# Patient Record
Sex: Female | Born: 1980
Health system: Southern US, Community
[De-identification: ages and names within clinical notes are randomized; demographics above are authoritative.]

## PROBLEM LIST (undated history)

## (undated) DIAGNOSIS — R1310 Dysphagia, unspecified: Secondary | ICD-10-CM

## (undated) DIAGNOSIS — J3089 Other allergic rhinitis: Secondary | ICD-10-CM

## (undated) DIAGNOSIS — K219 Gastro-esophageal reflux disease without esophagitis: Secondary | ICD-10-CM

## (undated) DIAGNOSIS — R87629 Unspecified abnormal cytological findings in specimens from vagina: Secondary | ICD-10-CM

## (undated) DIAGNOSIS — N63 Unspecified lump in unspecified breast: Secondary | ICD-10-CM

## (undated) DIAGNOSIS — N926 Irregular menstruation, unspecified: Secondary | ICD-10-CM

## (undated) HISTORY — DX: Unspecified abnormal cytological findings in specimens from vagina: R87.629

## (undated) HISTORY — DX: Irregular menstruation, unspecified: N92.6

## (undated) HISTORY — PX: TUBAL LIGATION: SHX77

## (undated) HISTORY — DX: Dysphagia, unspecified: R13.10

---
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
## 2005-06-12 ENCOUNTER — Ambulatory Visit: Payer: Self-pay | Admitting: Family Medicine

## 2007-01-25 ENCOUNTER — Observation Stay: Payer: Self-pay | Admitting: Obstetrics and Gynecology

## 2007-01-29 ENCOUNTER — Observation Stay: Payer: Self-pay | Admitting: Obstetrics and Gynecology

## 2007-02-04 ENCOUNTER — Inpatient Hospital Stay: Payer: Self-pay

## 2009-02-12 ENCOUNTER — Ambulatory Visit: Payer: Self-pay | Admitting: Obstetrics and Gynecology

## 2009-02-19 ENCOUNTER — Ambulatory Visit: Payer: Self-pay | Admitting: Obstetrics and Gynecology

## 2010-05-31 ENCOUNTER — Ambulatory Visit: Payer: Self-pay | Admitting: Gastroenterology

## 2010-06-14 ENCOUNTER — Ambulatory Visit: Payer: Self-pay | Admitting: Gastroenterology

## 2010-06-16 LAB — PATHOLOGY REPORT

## 2013-02-17 ENCOUNTER — Ambulatory Visit: Payer: Self-pay | Admitting: Gastroenterology

## 2013-02-19 LAB — PATHOLOGY REPORT

## 2014-12-19 ENCOUNTER — Encounter
Admission: EM | Disposition: A | Discharge status: Home / Self Care | Payer: Self-pay | Source: Home / Self Care | Attending: Emergency Medicine

## 2014-12-19 ENCOUNTER — Ambulatory Visit: Admission: EM | Admit: 2014-12-19 | Payer: Self-pay | Source: Ambulatory Visit | Admitting: Gastroenterology

## 2014-12-19 ENCOUNTER — Ambulatory Visit: Payer: BLUE CROSS/BLUE SHIELD | Admitting: Anesthesiology

## 2014-12-19 ENCOUNTER — Other Ambulatory Visit: Payer: Self-pay | Admitting: *Deleted

## 2014-12-19 ENCOUNTER — Ambulatory Visit
Admission: EM | Admit: 2014-12-19 | Discharge: 2014-12-19 | Disposition: A | Discharge status: Home / Self Care | Payer: BLUE CROSS/BLUE SHIELD | Attending: Emergency Medicine | Admitting: Emergency Medicine

## 2014-12-19 ENCOUNTER — Encounter: Payer: Self-pay | Admitting: Emergency Medicine

## 2014-12-19 DIAGNOSIS — K319 Disease of stomach and duodenum, unspecified: Secondary | ICD-10-CM | POA: Insufficient documentation

## 2014-12-19 DIAGNOSIS — X58XXXA Exposure to other specified factors, initial encounter: Secondary | ICD-10-CM | POA: Insufficient documentation

## 2014-12-19 DIAGNOSIS — R4702 Dysphasia: Secondary | ICD-10-CM | POA: Diagnosis present

## 2014-12-19 DIAGNOSIS — T18128A Food in esophagus causing other injury, initial encounter: Secondary | ICD-10-CM | POA: Insufficient documentation

## 2014-12-19 HISTORY — PX: ESOPHAGOGASTRODUODENOSCOPY: SHX5428

## 2014-12-19 HISTORY — PX: FOREIGN BODY REMOVAL: SHX962

## 2014-12-19 HISTORY — DX: Gastro-esophageal reflux disease without esophagitis: K21.9

## 2014-12-19 LAB — CBC WITH DIFFERENTIAL/PLATELET
Basophils Absolute: 0 10*3/uL (ref 0–0.1)
Basophils Relative: 1 %
Eosinophils Absolute: 0.4 10*3/uL (ref 0–0.7)
Eosinophils Relative: 8 %
HCT: 37.6 % (ref 35.0–47.0)
HEMOGLOBIN: 11.8 g/dL — AB (ref 12.0–16.0)
LYMPHS PCT: 31 %
Lymphs Abs: 1.5 10*3/uL (ref 1.0–3.6)
MCH: 24.7 pg — ABNORMAL LOW (ref 26.0–34.0)
MCHC: 31.4 g/dL — ABNORMAL LOW (ref 32.0–36.0)
MCV: 78.5 fL — ABNORMAL LOW (ref 80.0–100.0)
Monocytes Absolute: 0.5 10*3/uL (ref 0.2–0.9)
Monocytes Relative: 10 %
NEUTROS ABS: 2.4 10*3/uL (ref 1.4–6.5)
Neutrophils Relative %: 50 %
Platelets: 305 10*3/uL (ref 150–440)
RBC: 4.78 MIL/uL (ref 3.80–5.20)
RDW: 15.3 % — AB (ref 11.5–14.5)
WBC: 4.9 10*3/uL (ref 3.6–11.0)

## 2014-12-19 LAB — BASIC METABOLIC PANEL
Anion gap: 5 (ref 5–15)
BUN: 8 mg/dL (ref 6–20)
CHLORIDE: 111 mmol/L (ref 101–111)
CO2: 24 mmol/L (ref 22–32)
CREATININE: 0.87 mg/dL (ref 0.44–1.00)
Calcium: 9.1 mg/dL (ref 8.9–10.3)
GFR calc Af Amer: >60 mL/min (ref >60)
Glucose, Bld: 118 mg/dL — ABNORMAL HIGH (ref 65–99)
Potassium: 4.2 mmol/L (ref 3.5–5.1)
Sodium: 140 mmol/L (ref 135–145)

## 2014-12-19 LAB — POCT PREGNANCY, URINE: Preg Test, Ur: NEGATIVE

## 2014-12-19 SURGERY — EGD (ESOPHAGOGASTRODUODENOSCOPY)
Anesthesia: General

## 2014-12-19 SURGERY — ESOPHAGOGASTRODUODENOSCOPY (EGD) WITH PROPOFOL
Anesthesia: General

## 2014-12-19 MED ORDER — LIDOCAINE VISCOUS 2 % MT SOLN
OROMUCOSAL | Status: AC
  Filled 2014-12-19: qty 15

## 2014-12-19 MED ORDER — LIDOCAINE HCL (CARDIAC) 20 MG/ML IV SOLN
INTRAVENOUS | Status: DC | PRN
  Administered 2014-12-19: 60 mg via INTRAVENOUS

## 2014-12-19 MED ORDER — ONDANSETRON HCL 4 MG/2ML IJ SOLN: 4.0000 mg | Freq: Once | INTRAMUSCULAR | Status: DC | PRN

## 2014-12-19 MED ORDER — GLUCAGON HCL (RDNA) 1 MG IJ SOLR
1.0000 mg | Freq: Once | INTRAMUSCULAR | Status: AC
  Administered 2014-12-19: 1 mg via INTRAVENOUS

## 2014-12-19 MED ORDER — PROPOFOL 10 MG/ML IV BOLUS
INTRAVENOUS | Status: DC | PRN
  Administered 2014-12-19: 20 mg via INTRAVENOUS
  Administered 2014-12-19: 180 mg via INTRAVENOUS

## 2014-12-19 MED ORDER — PHENYLEPHRINE HCL 10 MG/ML IJ SOLN
INTRAMUSCULAR | Status: DC | PRN
  Administered 2014-12-19: 100 ug via INTRAVENOUS

## 2014-12-19 MED ORDER — ONDANSETRON HCL 4 MG/2ML IJ SOLN
INTRAMUSCULAR | Status: DC | PRN
  Administered 2014-12-19: 4 mg via INTRAVENOUS

## 2014-12-19 MED ORDER — LIDOCAINE VISCOUS 2 % MT SOLN
15.0000 mL | Freq: Once | OROMUCOSAL | Status: AC
  Administered 2014-12-19: 15 mL via OROMUCOSAL

## 2014-12-19 MED ORDER — SUCCINYLCHOLINE CHLORIDE 20 MG/ML IJ SOLN
INTRAMUSCULAR | Status: DC | PRN
  Administered 2014-12-19: 100 mg via INTRAVENOUS

## 2014-12-19 MED ORDER — FENTANYL CITRATE (PF) 100 MCG/2ML IJ SOLN
INTRAMUSCULAR | Status: DC | PRN
  Administered 2014-12-19 (×2): 50 ug via INTRAVENOUS

## 2014-12-19 MED ORDER — EPHEDRINE SULFATE 50 MG/ML IJ SOLN
INTRAMUSCULAR | Status: DC | PRN
  Administered 2014-12-19: 10 mg via INTRAVENOUS

## 2014-12-19 MED ORDER — GLUCAGON HCL RDNA (DIAGNOSTIC) 1 MG IJ SOLR
INTRAMUSCULAR | Status: AC
  Administered 2014-12-19: 1 mg via INTRAVENOUS
  Filled 2014-12-19: qty 1

## 2014-12-19 MED ORDER — DEXAMETHASONE SODIUM PHOSPHATE 4 MG/ML IJ SOLN
INTRAMUSCULAR | Status: DC | PRN
  Administered 2014-12-19: 10 mg via INTRAVENOUS

## 2014-12-19 MED ORDER — SODIUM CHLORIDE 0.9 % IV SOLN
INTRAVENOUS | Status: DC
  Administered 2014-12-19: 15:00:00 via INTRAVENOUS

## 2014-12-19 MED ORDER — FENTANYL CITRATE (PF) 100 MCG/2ML IJ SOLN: 25.0000 ug | INTRAMUSCULAR | Status: DC | PRN

## 2014-12-19 NOTE — Transfer of Care (Signed)
Immediate Anesthesia Transfer of Care Note  Patient: Elizabeth Lawson  Procedure(s) Performed: Procedure(s): ESOPHAGOGASTRODUODENOSCOPY (EGD) (N/A) FOREIGN BODY REMOVAL (N/A)  Patient Location: PACU  Anesthesia Type:General  Level of Consciousness: awake, alert  and oriented  Airway & Oxygen Therapy: Patient Spontanous Breathing and Patient connected to face mask oxygen  Post-op Assessment: Report given to RN and Post -op Vital signs reviewed and stable  Post vital signs: stable  Last Vitals:  Filed Vitals:   12/19/14 1607  BP: 106/68  Pulse: 91  Temp: 36.9 C  Resp: 16    Complications: No apparent anesthesia complications

## 2014-12-19 NOTE — Anesthesia Preprocedure Evaluation (Signed)
Anesthesia Evaluation  Patient identified by MRN, date of birth, ID band Patient awake    Reviewed: Allergy & Precautions, NPO status , Patient's Chart, lab work & pertinent test results  History of Anesthesia Complications Negative for: history of anesthetic complications  Airway Mallampati: II  TM Distance: >3 FB Neck ROM: Full    Dental no notable dental hx.    Pulmonary neg pulmonary ROS,  breath sounds clear to auscultation  Pulmonary exam normal       Cardiovascular Exercise Tolerance: Good negative cardio ROS Normal cardiovascular examRhythm:Regular Rate:Normal     Neuro/Psych negative neurological ROS  negative psych ROS   GI/Hepatic Neg liver ROS, GERD-  Medicated,  Endo/Other  negative endocrine ROS  Renal/GU negative Renal ROS  negative genitourinary   Musculoskeletal negative musculoskeletal ROS (+)   Abdominal   Peds negative pediatric ROS (+)  Hematology negative hematology ROS (+)   Anesthesia Other Findings   Reproductive/Obstetrics negative OB ROS                             Anesthesia Physical Anesthesia Plan  ASA: II  Anesthesia Plan: General   Post-op Pain Management:    Induction: Intravenous, Cricoid pressure planned and Rapid sequence  Airway Management Planned: Oral ETT  Additional Equipment:   Intra-op Plan:   Post-operative Plan: Extubation in OR  Informed Consent:   Dental advisory given  Plan Discussed with: CRNA and Surgeon  Anesthesia Plan Comments:         Anesthesia Quick Evaluation

## 2014-12-19 NOTE — Anesthesia Postprocedure Evaluation (Signed)
  Anesthesia Post-op Note  Patient: Elizabeth Lawson  Procedure(s) Performed: Procedure(s): ESOPHAGOGASTRODUODENOSCOPY (EGD) (N/A) FOREIGN BODY REMOVAL (N/A)  Anesthesia type:General  Patient location: PACU  Post pain: Pain level controlled  Post assessment: Post-op Vital signs reviewed, Patient's Cardiovascular Status Stable, Respiratory Function Stable, Patent Airway and No signs of Nausea or vomiting  Post vital signs: Reviewed and stable  Last Vitals:  Filed Vitals:   12/19/14 1652  BP: 133/90  Pulse: 84  Temp: 36.9 C  Resp: 17    Level of consciousness: awake, alert  and patient cooperative  Complications: No apparent anesthesia complications

## 2014-12-19 NOTE — ED Notes (Signed)
No resolution of symptoms with oral lidocaine. MD notified

## 2014-12-19 NOTE — ED Notes (Signed)
Patient states that she is having difficulty swallowing. Patient states that she has been seen prior by GI and has had upper endoscopy with diagnosis of GERD. Patient denies vomiting.

## 2014-12-19 NOTE — ED Notes (Signed)
Pt to ed with c/o difficulty swallowing since yesterday, states hx of same but has been told it is GERD.  Pt reports she is unable to even swallow liquids since yesterday.  Pt alert and oriented.

## 2014-12-19 NOTE — Anesthesia Procedure Notes (Signed)
Procedure Name: Intubation Date/Time: 12/19/2014 2:58 PM Performed by: Aline Brochure Pre-anesthesia Checklist: Patient identified, Emergency Drugs available, Suction available and Patient being monitored Patient Re-evaluated:Patient Re-evaluated prior to inductionOxygen Delivery Method: Circle system utilized Preoxygenation: Pre-oxygenation with 100% oxygen Intubation Type: IV induction, Rapid sequence and Cricoid Pressure applied Laryngoscope Size: McGraph Grade View: Grade I Tube type: Oral Tube size: 7.0 mm Number of attempts: 2 Airway Equipment and Method: Stylet Placement Confirmation: ETT inserted through vocal cords under direct vision,  positive ETCO2 and breath sounds checked- equal and bilateral Secured at: 21 cm Tube secured with: Tape Dental Injury: Teeth and Oropharynx as per pre-operative assessment

## 2014-12-19 NOTE — Consult Note (Signed)
GI Inpatient Consult Note Lollie Sails MD  Reason for Consult: Esophageal foreign body   Attending Requesting Consult: Dr. Lisa Roca  Outpatient Primary Physician: Not known  History of Present Illness: Elizabeth Lawson is a 34 y.o. female who presented to the emergency room early this morning following an episode of food getting apparently hung in the esophagus. She states that she was eating a steak last night and could not pass any water. States that the steak did come up after trying some water but then she was able able to get water down afterwards. In the emergency room several use of been tried including some viscous lidocaine and glucagon both of which failed to alleviate this symptom.  She states that she has had increasing symptoms for about a year and has had symptoms of dysphagia for several years. Indeed I did an EGD with her on 06/14/2010 that showing some mild gastritis and esophagitis but negative for other entities. Repeat EGD for this similar symptoms and 02/17/2013. At that time biopsies were consistent with eosinophilic esophagitis and eosinophilic gastritis. She had been prescribed a PPI at that time and on the initial EGD but has not been taking them. States that she will occasionally regurgitate foods. She denies any, or chest pain with the exception of when she gets food that seems to be going down slowly. She denies any heartburn. No nausea or vomiting otherwise. As a bowel movement about every 2 days. There is no black stool blood in the stool or slimy stools.  Her biopsies on both scopes were negative for Helicobacter pylori.  GI family history is pertinent for father with peptic ulcer disease. Negative otherwise  Past Medical History:  Past Medical History  Diagnosis Date  . GERD (gastroesophageal reflux disease)    patient denies taking any medications as an outpatient with the exception of occasional Benadryl or Allegra and occasional Tylenol. She denies  use of aspirin or NSAID products. Patient denies any ongoing medical treatment such as for high blood pressure diabetes thyroid problems or otherwise.  She has had 1 normal vaginal delivery without complication. Problem List: There are no active problems to display for this patient.   Past Surgical History: History reviewed. No pertinent past surgical history.  Allergies: No Known Allergies  Home Medications:  (Not in a hospital admission) Home medication reconciliation was completed with the patient.   Scheduled Inpatient Medications: None/not applicable    Continuous Inpatient Infusions: Saline    PRN Inpatient Medications: None/not applicable   Family History: family history includes Hypertension in her father.   GI Family History: Noted above  Social History:   reports that she has never smoked. She does not have any smokeless tobacco history on file. She reports that she does not drink alcohol or use illicit drugs. The patient denies ETOH, tobacco, or drug use.   ROS  Review of Systems:   Physical Examination: BP 113/66 mmHg  Pulse 80  Temp(Src) 98.1 F (36.7 C) (Oral)  Resp 18  Ht 5\' 10"  (1.778 m)  Wt 117.935 kg (260 lb)  BMI 37.31 kg/m2  SpO2 99%  LMP 12/05/2014 Gen: Well-appearing 34 year old Caucasian female no acute distress HEENT: Normocephalic atraumatic eyes are anicteric Neck: Supple no JVD no lymphadenopathy no thyromegaly Chest: Regular rate and rhythm without rub or gallop CV: Bilaterally clear to auscultation Abd: Soft nontender nondistended bowel sounds positive normoactive Ext: No clubbing cyanosis or edema Skin: Other:  Data: Lab Results  Component Value Date  WBC 4.9 12/19/2014   HGB 11.8* 12/19/2014   HCT 37.6 12/19/2014   MCV 78.5* 12/19/2014   PLT 305 12/19/2014    Recent Labs Lab 12/19/14 0941  HGB 11.8*   Lab Results  Component Value Date   NA 140 12/19/2014   K 4.2 12/19/2014   CL 111 12/19/2014   CO2 24  12/19/2014   BUN 8 12/19/2014   CREATININE 0.87 12/19/2014   No results found for: ALT, AST, GGT, ALKPHOS, BILITOT No results for input(s): APTT, INR, PTT in the last 168 hours. CBC Latest Ref Rng 12/19/2014  WBC 3.6 - 11.0 K/uL 4.9  Hemoglobin 12.0 - 16.0 g/dL 11.8(L)  Hematocrit 35.0 - 47.0 % 37.6  Platelets 150 - 440 K/uL 305    STUDIES: No results found. @IMAGES @  Assessment: 1. Possible esophageal foreign body. Highly suspect for eosinophilic esophagitis. Patient is not been taking any medications in this regard.  Recommendations: 1. Recommend EGD with anesthesia assistance. We'll be necessary to intubate the patient to protect the airway. I have discussed the risks benefits and complications of procedures to include not limited to bleeding, infection, perforation and the risk of sedation and the patient wishes to proceed.  Thank you for the consult. Please call with questions or concerns.  Lollie Sails, MD  12/19/2014 11:45 AM

## 2014-12-19 NOTE — OR Nursing (Signed)
Pt recovered in PACU, and discharge instructions were given to pt husband per ENDO staff RN and Dr Donnella Sham.  Pt had uneventful recovery and minimal nausea.  Discharged home with family, stable.

## 2014-12-19 NOTE — Op Note (Addendum)
[facility] Gastroenterology Patient Name: Elizabeth Lawson Procedure Date: 12/19/2014 12:24 PM MRN: [id number] Account #: [id number] Date of Birth: 11-Jun-1981 Admit Type: Outpatient Age: 34 Room: [facility] ENDO ROOM 4 Gender: Female Note Status: Finalized Procedure:         Upper GI endoscopy Indications:       Therapeutic procedure, Foreign body in the esophagus Providers:         Lollie Sails, MD Referring MD:      Guadalupe Maple, MD (Referring MD) Medicines:         General Anesthesia Complications:     No immediate complications. Procedure:         Pre-Anesthesia Assessment:                    - ASA Grade Assessment: I - A normal, healthy patient.                    After obtaining informed consent, the endoscope was passed                     under direct vision. Throughout the procedure, the                     patient's blood pressure, pulse, and oxygen saturations                     were monitored continuously. The Olympus GIF-160 endoscope                     (S#. [id number]) was introduced through the mouth, and                     advanced to the third part of duodenum. The upper GI                     endoscopy was performed with moderate difficulty due to                     presence of food. The patient tolerated the procedure well. Findings:      Food was found in the middle third of the esophagus and in the lower       third of the esophagus. Removal of food was accomplished with a Jabier Mutton net       in several passes. The esophagus itself seemed to have a normal lumen       dimension, however there were mild rings noted at times consistant with       either eosinphilic esophagitis or feline motility. There is a mild       mucosal irritation in the distal esophagus though no overt erosions.      Once the food debris was cleared, entrance into the stomach seemed       restricted, though there is no obvious ring or stricture. This was       passed and  scope was moved into the gastric vault.      A few localized, small non-bleeding erosions were found in the gastric       antrum. There were no stigmata of recent bleeding.      The cardia and gastric fundus were normal on retroflexion.      The examined duodenum was normal. Impression:        - Food in the middle third of the esophagus and in  the                     lower third of the esophagus. Removal was successful.                    - Non-bleeding erosive gastropathy.                    - Normal examined duodenum. Recommendation:    - Discharge patient to home.                    - Use Protonix (pantoprazole) 40 mg PO BID for 6 weeks.                    - Use Protonix (pantoprazole) 40 mg PO daily daily.                    - Repeat the upper endoscopy in 6 weeks to check healing. Procedure Code(s): --- Professional ---                    [phone], Esophagogastroduodenoscopy, flexible, transoral;                     with removal of foreign body(s) Diagnosis Code(s): --- Professional ---                    935.1, Foreign body in esophagus                    [phone], Foreign body accidentally entering other orifice                    537.9, Unspecified disorder of stomach and duodenum CPT copyright 2014 [employer]. All rights reserved. The codes documented in this report are preliminary and upon coder review may  be revised to meet current compliance requirements. Lollie Sails, MD 12/19/2014 4:13:50 PM This report has been signed electronically. Number of Addenda: 0 Note Initiated On: 12/19/2014 12:24 PM      [facility]

## 2014-12-19 NOTE — OR Nursing (Signed)
Pt intubated per crna and anesthiologist. See notes

## 2014-12-19 NOTE — ED Provider Notes (Signed)
[facility] Emergency Department Provider Note   ____________________________________________  Time seen: 8 AM I have reviewed the triage vital signs and the triage nursing note.  HISTORY  Chief Complaint Dysphagia   Historian Patient  HPI Elizabeth Lawson is a 34 y.o. female who is complaining of trouble swallowing including trouble swallowing water. She had trouble eating her dinner of steak and potatoes last night due to similar problems in the past of dysphagia. She has had 2 endoscopies which showed signs of GERD but no stricture. She often has problems swallowing solid foods, however after last night she's been having trouble swallowing water and spitting it right back up. She did sleep overnight and so she thinks that she was managing her saliva. However this morning she was unable to swallow water without gagging and spitting up back up. Symptoms are moderate    Past Medical History  Diagnosis Date  . GERD (gastroesophageal reflux disease)     There are no active problems to display for this patient.   History reviewed. No pertinent past surgical history.  Current Outpatient Rx  Name  Route  Sig  Dispense  Refill  . diphenhydrAMINE (BENADRYL) 25 MG tablet   Oral   Take 100 mg by mouth daily.         . fexofenadine (ALLEGRA) 180 MG tablet   Oral   Take 180 mg by mouth daily.           Not currently on any acid reflux medications  Allergies Review of patient's allergies indicates no known allergies.  Family History  Problem Relation Age of Onset  . Hypertension Father     Social History History  Substance Use Topics  . Smoking status: Never Smoker   . Smokeless tobacco: Not on file  . Alcohol Use: No    Review of Systems  Constitutional: Negative for fever. Eyes: Negative for visual changes. ENT: Negative for sore throat. Cardiovascular: Negative for chest pain. Respiratory: Negative for shortness of  breath. Gastrointestinal: Negative for abdominal pain, vomiting and diarrhea. Genitourinary: Negative for dysuria. Musculoskeletal: Negative for back pain. Skin: Negative for rash. Neurological: Negative for headaches, focal weakness or numbness.  ____________________________________________   PHYSICAL EXAM:  VITAL SIGNS: ED Triage Vitals  Enc Vitals Group     BP 12/19/14 0736 131/101 mmHg     Pulse Rate 12/19/14 0736 91     Resp 12/19/14 0736 18     Temp 12/19/14 0736 98.1 F (36.7 C)     Temp Source 12/19/14 0736 Oral     SpO2 12/19/14 0736 97 %     Weight 12/19/14 0736 260 lb (117.935 kg)     Height 12/19/14 0736 5\' 10"  (1.778 m)     Head Cir --      Peak Flow --      Pain Score 12/19/14 0737 0     Pain Loc --      Pain Edu? --      Excl. in [place]? --      Constitutional: Alert and oriented. Well appearing and in no distress. Eyes: Conjunctivae are normal. PERRL. Normal extraocular movements. ENT   Head: Normocephalic and atraumatic.   Nose: No congestion/rhinnorhea.   Mouth/Throat: Mucous membranes are moist. Managing oral secretions well   Neck: No stridor. Cardiovascular: Normal rate, regular rhythm.  No murmurs, rubs, or gallops. Respiratory: Normal respiratory effort without tachypnea nor retractions. Breath sounds are clear and equal bilaterally. No wheezes/rales/rhonchi. Gastrointestinal: Soft and nontender.  No distention. Obese Genitourinary: Deferred Musculoskeletal: Nontender with normal range of motion in all extremities. No joint effusions.  No lower extremity tenderness nor edema. Neurologic:  Normal speech and language. No gross focal neurologic deficits are appreciated. Skin:  Skin is warm, dry and intact. No rash noted. Psychiatric: Mood and affect are normal. Speech and behavior are normal. Patient exhibits appropriate insight and judgment.  ____________________________________________    EKG  None ____________________________________________  LABS (pertinent positives/negatives)  Metabolic panel within normal limits CBC showing a normal white blood cell count 4.9 and hemoglobin at 11.8 no other significant abnormalities.  ____________________________________________  RADIOLOGY Radiologist results reviewed  None __________________________________________  PROCEDURES  Procedure(s) performed: None Critical Care performed: None  ____________________________________________   ED COURSE / ASSESSMENT AND PLAN  Pertinent labs & imaging results that were available during my care of the patient were reviewed by me and considered in my medical decision making (see chart for details).  Patient has had acute on chronic dysphagia, worsening since last night. Here in the emergency department she does appear able to manage her oral secretions. She's can be given viscous lidocaine and then attempt oral by mouth challenge.  After viscous lidocaine patient did spit this back up. She did try home which would not work either. She was given IV glucagon, and this did not work either.  Dr. Gustavo Lah with GI was consultation for esophageal obstruction at 9:40 AM. Patient and family were updated and are waiting on specialist consultation for plan/disposition.   ___________________________________________   FINAL CLINICAL IMPRESSION(S) / ED DIAGNOSES   Final diagnoses:  Dysphasia      Lisa Roca, MD 12/19/14 1116

## 2014-12-23 ENCOUNTER — Encounter: Payer: Self-pay | Admitting: Gastroenterology

## 2015-02-04 ENCOUNTER — Other Ambulatory Visit: Payer: Self-pay | Admitting: Gastroenterology

## 2015-02-04 DIAGNOSIS — R1310 Dysphagia, unspecified: Secondary | ICD-10-CM

## 2015-02-10 ENCOUNTER — Ambulatory Visit: Payer: BLUE CROSS/BLUE SHIELD

## 2015-03-02 ENCOUNTER — Ambulatory Visit
Admission: RE | Admit: 2015-03-02 | Payer: BLUE CROSS/BLUE SHIELD | Source: Ambulatory Visit | Admitting: Gastroenterology

## 2015-03-02 ENCOUNTER — Encounter: Admission: RE | Payer: Self-pay | Source: Ambulatory Visit

## 2015-03-02 SURGERY — ESOPHAGOGASTRODUODENOSCOPY (EGD) WITH PROPOFOL
Anesthesia: General

## 2015-05-19 LAB — HM PAP SMEAR: HM Pap smear: NEGATIVE

## 2015-06-14 HISTORY — PX: OTHER SURGICAL HISTORY: SHX169

## 2015-10-26 ENCOUNTER — Encounter: Payer: Self-pay | Admitting: Obstetrics and Gynecology

## 2015-12-22 ENCOUNTER — Encounter: Payer: Self-pay | Admitting: Obstetrics and Gynecology

## 2015-12-22 ENCOUNTER — Other Ambulatory Visit: Payer: Self-pay | Admitting: Obstetrics and Gynecology

## 2015-12-22 ENCOUNTER — Ambulatory Visit (INDEPENDENT_AMBULATORY_CARE_PROVIDER_SITE_OTHER): Payer: BLUE CROSS/BLUE SHIELD | Admitting: Obstetrics and Gynecology

## 2015-12-22 VITALS — BP 109/85 | HR 97 | Ht 70.0 in | Wt 256.3 lb

## 2015-12-22 DIAGNOSIS — IMO0002 Reserved for concepts with insufficient information to code with codable children: Secondary | ICD-10-CM

## 2015-12-22 DIAGNOSIS — R896 Abnormal cytological findings in specimens from other organs, systems and tissues: Secondary | ICD-10-CM

## 2015-12-22 DIAGNOSIS — Z8041 Family history of malignant neoplasm of ovary: Secondary | ICD-10-CM | POA: Diagnosis not present

## 2015-12-22 NOTE — Progress Notes (Signed)
Subjective:     Patient ID: Elizabeth Lawson, female   DOB: 03/16/1981, 35 y.o.   MRN: [id number]  HPI Here to establish care and needs repeat pap- has records- reveals h/o LGSIL on last two paps with same noted on colpo in Nov 2016. Needs follow-up pap now. Also reports mom with history of genetic ovarian cancer 5 years ago, and 2 maternal great aunts with breast cancer in their fifties. Desires genetic screening.  Review of Systems See above    Objective:   Physical Exam A&O x4  well groomed female in no distress Pelvic exam: normal external genitalia, vulva, vagina, cervix, uterus and adnexa, PAP: Pap smear done today.    Assessment:     LGSIL- follow-up Family history of maternal ovarian and breast cancers     Plan:     Pap obtained [facility] obtained RTC in 6 months or as needed  Melody Piqua, [place]

## 2015-12-22 NOTE — Patient Instructions (Signed)
Thank you for enrolling in [facility]. Please follow the instructions below to securely access your online medical record. MyChart allows you to send messages to your doctor, view your test results, renew your prescriptions, schedule appointments, and more.  How Do I Sign Up? 1. In your Internet browser, go to [email or web address]. 2. Click on the New  User? link in the Sign In box.  3. Enter your MyChart Access Code exactly as it appears below. You will not need to use this code after you have completed the sign-up process. If you do not sign up before the expiration date, you must request a new code. MyChart Access Code: [id number] Expires: 02/19/2016 10:42 AM  4. Enter the last four digits of your Social Security Number (xxxx) and Date of Birth (mm/dd/yyyy) as indicated and click Next. You will be taken to the next sign-up page. 5. Create a MyChart ID. This will be your MyChart login ID and cannot be changed, so think of one that is secure and easy to remember. 6. Create a MyChart password. You can change your password at any time. 7. Enter your Password Reset Question and Answer and click Next. This can be used at a later time if you forget your password.  8. Select your communication preference, and if applicable enter your e-mail address. You will receive e-mail notification when new information is available in MyChart by choosing to receive e-mail notifications and filling in your e-mail. 9. Click Sign In. You can now view your medical record.   Additional Information If you have questions, you can email [email or web address] or call [phone] to talk to our [facility] staff. Remember, MyChart is NOT to be used for urgent needs. For medical emergencies, dial 911.   Human Papillomavirus Human papillomavirus (HPV) is the most common sexually transmitted infection (STI) and is highly contagious. HPV infections cause genital warts and cancers to the outlet of the  womb (cervix), birth canal (vagina), opening of the birth canal (vulva), and anus. There are over 100 types of HPV. Unless wartlike lesions are present in the throat or there are genital warts that you can see or feel, HPV usually does not cause symptoms. It is possible to be infected for long periods and pass it on to others without knowing it. CAUSES  HPV is spread from person to person through sexual contact. This includes oral, vaginal, or anal sex. RISK FACTORS  Having unprotected sex. HPV can be spread by oral, vaginal, or anal sex.  Having several sex partners.  Having a sex partner who has other sex partners.  Having or having had another sexually transmitted infection. SIGNS AND SYMPTOMS  Most people carrying HPV do not have any symptoms. If symptoms are present, symptoms may include:  Wartlike lesions in the throat (from having oral sex).  Warts in the infected skin or mucous membranes.  Genital warts that may itch, burn, or bleed.  Genital warts that may be painful or bleed during sexual intercourse. DIAGNOSIS  If wartlike lesions are present in the throat or genital warts are present, your health care provider can usually diagnose HPV by physical examination.   Genital warts are easily seen with the naked eye.  Currently, there is no FDA-approved test to detect HPV in males.  In females, a Pap test can show cells that are infected with HPV.  In females, a scope can be used to view the cervix (colposcopy). A colposcopy can be performed if the pelvic exam  or Pap test is abnormal. A sample of tissue may be removed (biopsy) during the colposcopy. TREATMENT  There is no treatment for the virus itself. However, there are treatments for the health problems and symptoms HPV can cause. Your health care provider will follow you closely after you are treated. This is because the HPV can come back and may need treatment again. Treatment of HPV may include:   Medicines, which may be  injected or applied in a cream, lotion, or gel form.  Use of a probe to apply extreme cold (cryotherapy).  Application of an intense beam of light (laser treatment).  Use of a probe to apply extreme heat (electrocautery).  Surgery. HOME CARE INSTRUCTIONS   Take medicines only as directed by your health care provider.  Use over-the-counter creams for itching or irritation as directed by your health care provider.  Keep all follow-up visits as directed by your health care provider. This is important.  Do not touch or scratch the warts.  Do not treat genital warts with medicines used for treating hand warts.  Do not have sex while you are being treated.  Do not douche or use tampons during treatment of HPV.  Tell your sex partner about your infection because he or she may also need treatment.  If you become pregnant, tell your health care provider that you have had HPV. Your health care provider will monitor you closely during pregnancy to be sure your baby is safe.  After treatment, use condoms during sex to prevent future infections.  Have only one sex partner.  Have a sex partner who does not have other sex partners. PREVENTION   Talk to your health care provider about getting the HPV vaccines. These vaccines prevent some HPV infections and cancers. It is recommended that the vaccine be given to males and females between the ages of 3 and 69 years old. It will not work if you already have HPV, and it is not recommended for pregnant women.  A Pap test is done to screen for cervical cancer in women.  The first Pap test should be done at age 40 years.  Between ages 59 and 60 years, Pap tests are repeated every 2 years.  Beginning at age 94, you are advised to have a Pap test every 3 years as long as your past 3 Pap tests have been normal.  Some women have medical problems that increase the chance of getting cervical cancer. Talk to your health care provider about these  problems. It is especially important to talk to your health care provider if a new problem develops soon after your last Pap test. In these cases, your health care provider may recommend more frequent screening and Pap tests.  The above recommendations are the same for women who have or have not gotten the vaccine for HPV.  If you had a hysterectomy for a problem that was not a cancer or a condition that could lead to cancer, then you no longer need Pap tests. However, even if you no longer need a Pap test, a regular exam is a good idea to make sure no other problems are starting.   If you are between the ages of 65 and 50 years and you have had normal Pap tests going back 10 years, you no longer need Pap tests. However, even if you no longer need a Pap test, a regular exam is a good idea to make sure no other problems are starting.  If you  have had past treatment for cervical cancer or a condition that could lead to cancer, you need Pap tests and screening for cancer for at least 20 years after your treatment.  If Pap tests have been discontinued, risk factors (such as a new sexual partner)need to be reassessed to determine if screening should be resumed.  Some women may need screenings more often if they are at high risk for cervical cancer. SEEK MEDICAL CARE IF:   The treated skin becomes red, swollen, or painful.  You have a fever.  You feel generally ill.  You feel lumps or pimple-like projections in and around your genital area.  You develop bleeding of the vagina or the treatment area.  You have painful sexual intercourse. MAKE SURE YOU:   Understand these instructions.  Will watch your condition.  Will get help if you are not doing well or get worse.   This information is not intended to replace advice given to you by your health care provider. Make sure you discuss any questions you have with your health care provider.   Document Released: 09/23/2003 Document Revised:  07/24/2014 Document Reviewed: 10/08/2013 Elsevier Interactive Patient Education [employer].

## 2015-12-24 LAB — CYTOLOGY - PAP

## 2016-02-21 ENCOUNTER — Encounter: Payer: Self-pay | Admitting: Obstetrics and Gynecology

## 2016-03-01 ENCOUNTER — Encounter: Payer: Self-pay | Admitting: Obstetrics and Gynecology

## 2016-03-06 ENCOUNTER — Encounter: Payer: Self-pay | Admitting: Obstetrics and Gynecology

## 2016-06-16 ENCOUNTER — Encounter: Payer: BLUE CROSS/BLUE SHIELD | Admitting: Obstetrics and Gynecology

## 2016-06-22 ENCOUNTER — Encounter: Payer: BLUE CROSS/BLUE SHIELD | Admitting: Obstetrics and Gynecology

## 2016-08-31 ENCOUNTER — Other Ambulatory Visit: Payer: Self-pay | Admitting: Obstetrics and Gynecology

## 2016-08-31 ENCOUNTER — Ambulatory Visit (INDEPENDENT_AMBULATORY_CARE_PROVIDER_SITE_OTHER): Payer: BLUE CROSS/BLUE SHIELD | Admitting: Obstetrics and Gynecology

## 2016-08-31 ENCOUNTER — Encounter: Payer: Self-pay | Admitting: Obstetrics and Gynecology

## 2016-08-31 VITALS — BP 113/80 | HR 112 | Ht 70.0 in | Wt 261.1 lb

## 2016-08-31 DIAGNOSIS — Z01419 Encounter for gynecological examination (general) (routine) without abnormal findings: Secondary | ICD-10-CM | POA: Diagnosis not present

## 2016-08-31 DIAGNOSIS — M25512 Pain in left shoulder: Secondary | ICD-10-CM

## 2016-08-31 DIAGNOSIS — E669 Obesity, unspecified: Secondary | ICD-10-CM

## 2016-08-31 DIAGNOSIS — N62 Hypertrophy of breast: Secondary | ICD-10-CM | POA: Diagnosis not present

## 2016-08-31 DIAGNOSIS — Z803 Family history of malignant neoplasm of breast: Secondary | ICD-10-CM

## 2016-08-31 DIAGNOSIS — E66811 Obesity, class 1: Secondary | ICD-10-CM

## 2016-08-31 DIAGNOSIS — M25511 Pain in right shoulder: Secondary | ICD-10-CM

## 2016-08-31 NOTE — Patient Instructions (Signed)
 Preventive Care 18-39 Years, Female Preventive care refers to lifestyle choices and visits with your health care provider that can promote health and wellness. What does preventive care include?  A yearly physical exam. This is also called an annual well check.  Dental exams once or twice a year.  Routine eye exams. Ask your health care provider how often you should have your eyes checked.  Personal lifestyle choices, including:  Daily care of your teeth and gums.  Regular physical activity.  Eating a healthy diet.  Avoiding tobacco and drug use.  Limiting alcohol use.  Practicing safe sex.  Taking vitamin and mineral supplements as recommended by your health care provider. What happens during an annual well check? The services and screenings done by your health care provider during your annual well check will depend on your age, overall health, lifestyle risk factors, and family history of disease. Counseling  Your health care provider may ask you questions about your:  Alcohol use.  Tobacco use.  Drug use.  Emotional well-being.  Home and relationship well-being.  Sexual activity.  Eating habits.  Work and work environment.  Method of birth control.  Menstrual cycle.  Pregnancy history. Screening  You may have the following tests or measurements:  Height, weight, and BMI.  Diabetes screening. This is done by checking your blood sugar (glucose) after you have not eaten for a while (fasting).  Blood pressure.  Lipid and cholesterol levels. These may be checked every 5 years starting at age 20.  Skin check.  Hepatitis C blood test.  Hepatitis B blood test.  Sexually transmitted disease (STD) testing.  BRCA-related cancer screening. This may be done if you have a family history of breast, ovarian, tubal, or peritoneal cancers.  Pelvic exam and Pap test. This may be done every 3 years starting at age 21. Starting at age 30, this may be done  every 5 years if you have a Pap test in combination with an HPV test. Discuss your test results, treatment options, and if necessary, the need for more tests with your health care provider. Vaccines  Your health care provider may recommend certain vaccines, such as:  Influenza vaccine. This is recommended every year.  Tetanus, diphtheria, and acellular pertussis (Tdap, Td) vaccine. You may need a Td booster every 10 years.  Varicella vaccine. You may need this if you have not been vaccinated.  HPV vaccine. If you are 26 or younger, you may need three doses over 6 months.  Measles, mumps, and rubella (MMR) vaccine. You may need at least one dose of MMR. You may also need a second dose.  Pneumococcal 13-valent conjugate (PCV13) vaccine. You may need this if you have certain conditions and were not previously vaccinated.  Pneumococcal polysaccharide (PPSV23) vaccine. You may need one or two doses if you smoke cigarettes or if you have certain conditions.  Meningococcal vaccine. One dose is recommended if you are age 19-21 years and a first-year college student living in a residence hall, or if you have one of several medical conditions. You may also need additional booster doses.  Hepatitis A vaccine. You may need this if you have certain conditions or if you travel or work in places where you may be exposed to hepatitis A.  Hepatitis B vaccine. You may need this if you have certain conditions or if you travel or work in places where you may be exposed to hepatitis B.  Haemophilus influenzae type b (Hib) vaccine. You may need   this if you have certain risk factors. Talk to your health care provider about which screenings and vaccines you need and how often you need them. This information is not intended to replace advice given to you by your health care provider. Make sure you discuss any questions you have with your health care provider. Document Released: 08/29/2001 Document Revised:  03/22/2016 Document Reviewed: 05/04/2015 Elsevier Interactive Patient Education  2017 Elsevier Inc.  

## 2016-08-31 NOTE — Progress Notes (Signed)
Subjective:   Elizabeth Lawson is a 36 y.o. G28P0 Caucasian female here for a routine well-woman exam.  Patient's last menstrual period was 08/25/2016.    Current complaints: shoulder pain due to bra straps digging into them and upper back pain- worse after exercising. PCP: Chrissmon       does desire labs  Social History: Sexual: heterosexual Marital Status: married Living situation: with family Occupation: Network engineer job at [employer]: no tobacco use Illicit drugs: no history of illicit drug use  The following portions of the patient's history were reviewed and updated as appropriate: allergies, current medications, past family history, past medical history, past social history, past surgical history and problem list.  Past Medical History Past Medical History:  Diagnosis Date  . Dysphagia   . GERD (gastroesophageal reflux disease)   . Vaginal Pap smear, abnormal    ASCUS HR HPV negative (06/14/15)    Past Surgical History Past Surgical History:  Procedure Laterality Date  . coloposcopy  06/14/2015   bx cervix at 6:00 RESULTS: CIN I (Mild Squamous Dysplasia), LGSIL.  ECC: koilocytic atypia consistent with hpv  . ESOPHAGOGASTRODUODENOSCOPY N/A 12/19/2014   Procedure: ESOPHAGOGASTRODUODENOSCOPY (EGD);  Surgeon: Lollie Sails, MD;  Location: [facility] ENDOSCOPY;  Service: Endoscopy;  Laterality: N/A;  . FOREIGN BODY REMOVAL N/A 12/19/2014   Procedure: FOREIGN BODY REMOVAL;  Surgeon: Lollie Sails, MD;  Location: [facility] ENDOSCOPY;  Service: Endoscopy;  Laterality: N/A;  . TUBAL LIGATION Bilateral     Gynecologic History G1P0  Patient's last menstrual period was 08/25/2016. Contraception: tubal ligation Last Pap: 2017. Results were: normal Last mammogram: none.   Obstetric History OB History  Gravida Para Term Preterm AB Living  1         1  SAB TAB Ectopic Multiple Live Births          1    # Outcome Date GA Lbr Len/2nd Weight Sex Delivery Anes PTL Lv   1 Gravida      Vag-Spont   LIV      Current Medications Current Outpatient Prescriptions on File Prior to Visit  Medication Sig Dispense Refill  . diphenhydrAMINE (BENADRYL) 25 MG tablet Take 100 mg by mouth daily.    . fexofenadine (ALLEGRA) 180 MG tablet Take 180 mg by mouth daily.     No current facility-administered medications on file prior to visit.     Review of Systems Patient denies any headaches, blurred vision, shortness of breath, chest pain, abdominal pain, problems with bowel movements, urination, or intercourse.  Objective:  BP 113/80   Pulse (!) 112   Ht 5\' 10"  (1.778 m)   Wt 261 lb 1.6 oz (118.4 kg)   LMP 08/25/2016   BMI 37.46 kg/m  Physical Exam  General:  Well developed, well nourished, no acute distress. She is alert and oriented x3. Skin:  Warm and dry Neck:  Midline trachea, no thyromegaly or nodules Cardiovascular: Regular rate and rhythm, no murmur heard Lungs:  Effort normal, all lung fields clear to auscultation bilaterally Breasts:  No dominant palpable mass, retraction, or nipple discharge, large pendulous in size [facility]) with hydranetits scars under each breast. Abdomen:  Soft, non tender, no hepatosplenomegaly or masses Pelvic:  External genitalia is normal in appearance.  The vagina is normal in appearance. The cervix is bulbous, no CMT.  Thin prep pap is done with HR HPV cotesting. Uterus is felt to be normal size, shape, and contour.  No adnexal masses or tenderness noted.  Extremities:  No swelling or varicosities noted Psych:  She has a normal mood and affect  Assessment:   Healthy well-woman exam Shoulder pain secondary to large breast size Obesity Family history of breast and ovarian cancer- BRAC negative H/O abnormal pap  Plan:  Routine screening labs  Discussed weight loss program- will consider  F/U 1year for AE, or sooner if needed Mammogram ordered baseline  Melody Rockney Ghee, CNM

## 2016-09-01 ENCOUNTER — Ambulatory Visit (INDEPENDENT_AMBULATORY_CARE_PROVIDER_SITE_OTHER): Payer: BLUE CROSS/BLUE SHIELD | Admitting: Obstetrics and Gynecology

## 2016-09-01 LAB — THYROID PANEL WITH TSH
Free Thyroxine Index: 2.3 (ref 1.2–4.9)
T3 Uptake Ratio: 32 % (ref 24–39)
T4 TOTAL: 7.2 ug/dL (ref 4.5–12.0)
TSH: 1.33 u[IU]/mL (ref 0.450–4.500)

## 2016-09-01 LAB — COMPREHENSIVE METABOLIC PANEL
ALT: 21 IU/L (ref 0–32)
AST: 14 IU/L (ref 0–40)
Albumin/Globulin Ratio: 1.2 (ref 1.2–2.2)
Albumin: 4.2 g/dL (ref 3.5–5.5)
Alkaline Phosphatase: 94 IU/L (ref 39–117)
BUN/Creatinine Ratio: 14 (ref 9–23)
BUN: 13 mg/dL (ref 6–20)
Bilirubin Total: 0.3 mg/dL (ref 0.0–1.2)
CALCIUM: 9.5 mg/dL (ref 8.7–10.2)
CO2: 20 mmol/L (ref 18–29)
Chloride: 99 mmol/L (ref 96–106)
Creatinine, Ser: 0.96 mg/dL (ref 0.57–1.00)
GFR calc Af Amer: 87 mL/min/{1.73_m2} (ref >59)
GFR calc non Af Amer: 76 mL/min/{1.73_m2} (ref >59)
GLUCOSE: 74 mg/dL (ref 65–99)
Globulin, Total: 3.5 g/dL (ref 1.5–4.5)
Potassium: 4.3 mmol/L (ref 3.5–5.2)
Sodium: 141 mmol/L (ref 134–144)
Total Protein: 7.7 g/dL (ref 6.0–8.5)

## 2016-09-01 LAB — HEMOGLOBIN A1C
Est. average glucose Bld gHb Est-mCnc: 108 mg/dL
HEMOGLOBIN A1C: 5.4 % (ref 4.8–5.6)

## 2016-09-01 LAB — LIPID PANEL
CHOLESTEROL TOTAL: 203 mg/dL — AB (ref 100–199)
Chol/HDL Ratio: 4.1 ratio units (ref 0.0–4.4)
HDL: 50 mg/dL (ref >39)
LDL Calculated: 125 mg/dL — ABNORMAL HIGH (ref 0–99)
Triglycerides: 140 mg/dL (ref 0–149)
VLDL Cholesterol Cal: 28 mg/dL (ref 5–40)

## 2016-09-01 MED ORDER — PHENTERMINE HCL 37.5 MG PO CAPS: 37.5000 mg | ORAL_CAPSULE | ORAL | 0 refills | Status: DC

## 2016-09-01 MED ORDER — CYANOCOBALAMIN 1000 MCG/ML IJ SOLN
1000.0000 ug | Freq: Once | INTRAMUSCULAR | Status: AC
  Administered 2016-09-01: 1000 ug via INTRAMUSCULAR

## 2016-09-01 MED ORDER — CYANOCOBALAMIN 1000 MCG/ML IJ SOLN: 1000.0000 ug | Freq: Once | INTRAMUSCULAR | 2 refills | Status: AC

## 2016-09-01 NOTE — Progress Notes (Signed)
Patient ID: Elizabeth Lawson, female   DOB: 08-02-1980, 36 y.o.   MRN: [id number] Pt presents to start phentermine for weight loss and B-12 injection. MNB signed rx for phentermine. Pt aware to come set up appt for f/u weight, B/P, B-12. Pt aware of benefits and risk.

## 2016-09-07 LAB — CYTOLOGY - PAP

## 2016-09-18 ENCOUNTER — Encounter: Payer: Self-pay | Admitting: Obstetrics and Gynecology

## 2016-09-25 ENCOUNTER — Encounter: Payer: Self-pay | Admitting: Obstetrics and Gynecology

## 2016-09-25 ENCOUNTER — Other Ambulatory Visit: Payer: Self-pay | Admitting: *Deleted

## 2016-09-25 MED ORDER — CYANOCOBALAMIN 1000 MCG/ML IJ SOLN: 1000.0000 ug | INTRAMUSCULAR | 3 refills | Status: DC

## 2016-09-29 ENCOUNTER — Ambulatory Visit (INDEPENDENT_AMBULATORY_CARE_PROVIDER_SITE_OTHER): Payer: BLUE CROSS/BLUE SHIELD | Admitting: Obstetrics and Gynecology

## 2016-09-29 MED ORDER — CYANOCOBALAMIN 1000 MCG/ML IJ SOLN
1000.0000 ug | Freq: Once | INTRAMUSCULAR | Status: AC
  Administered 2016-09-29: 1000 ug via INTRAMUSCULAR

## 2016-09-29 MED ORDER — PHENTERMINE HCL 37.5 MG PO CAPS: 37.5000 mg | ORAL_CAPSULE | ORAL | 1 refills | Status: DC

## 2016-09-29 NOTE — Progress Notes (Signed)
Patient ID: Elizabeth Lawson, female   DOB: 04-25-1981, 36 y.o.   MRN: [id number] Pt presents for weight, B/P, B-12 injection. No side effects of medication-Phentermine, or B-12.  Weight loss of 10 lbs. Encouraged eating healthy and exercise. Phentermine rx given because of the last one did not have any refills and it should have had 2 refills.

## 2016-10-27 ENCOUNTER — Ambulatory Visit (INDEPENDENT_AMBULATORY_CARE_PROVIDER_SITE_OTHER): Payer: BLUE CROSS/BLUE SHIELD | Admitting: Obstetrics and Gynecology

## 2016-10-27 MED ORDER — CYANOCOBALAMIN 1000 MCG/ML IJ SOLN
1000.0000 ug | Freq: Once | INTRAMUSCULAR | Status: AC
  Administered 2016-10-27: 1000 ug via INTRAMUSCULAR

## 2016-10-27 NOTE — Progress Notes (Signed)
Patient ID: Elizabeth Lawson, female   DOB: September 08, 1980, 36 y.o.   MRN: [id number] Pt presents for weight, B/P, B-12 injection. No side effects of medication- B-12.  Weight loss of 9 lbs. Encouraged eating healthy and exercise. Pt states she did stop her phentermine 2 wks ago and noticed her ankles and feet swelling of which she looked up was a side effect. This went away after she stopped medication. Pt will see MNS for her next appt in 4 weeks. Did not want to reschedule any earlier. Advised not to take phentermine until she is seen and pt states she will not restart medication.

## 2016-11-24 ENCOUNTER — Encounter: Payer: BLUE CROSS/BLUE SHIELD | Admitting: Obstetrics and Gynecology

## 2016-11-29 ENCOUNTER — Encounter: Payer: Self-pay | Admitting: Obstetrics and Gynecology

## 2016-11-29 ENCOUNTER — Ambulatory Visit (INDEPENDENT_AMBULATORY_CARE_PROVIDER_SITE_OTHER): Payer: BLUE CROSS/BLUE SHIELD | Admitting: Obstetrics and Gynecology

## 2016-11-29 VITALS — BP 86/64 | HR 96 | Ht 70.0 in | Wt 243.8 lb

## 2016-11-29 DIAGNOSIS — R600 Localized edema: Secondary | ICD-10-CM | POA: Diagnosis not present

## 2016-11-29 DIAGNOSIS — E669 Obesity, unspecified: Secondary | ICD-10-CM

## 2016-11-29 MED ORDER — CYANOCOBALAMIN 1000 MCG/ML IJ SOLN: 1000.0000 ug | INTRAMUSCULAR | 3 refills | Status: DC

## 2016-11-29 MED ORDER — PHENTERMINE HCL 37.5 MG PO CAPS: 37.5000 mg | ORAL_CAPSULE | ORAL | 1 refills | Status: DC

## 2016-11-29 NOTE — Progress Notes (Signed)
SUBJECTIVE:  36 y.o. here for follow-up weight loss visit, previously seen 4 weeks ago. Denies any concerns and feels like medication has worked well. Has lost  20# in 3 months. Has been under a lot of stress the last month and has slacked off in exercising, but plans to restart. Did report swelling in both ankles and she stopped the medicine and it went away.  OBJECTIVE:  BP (!) 86/64   Pulse 96   Ht 5\' 10"  (1.778 m)   Wt 243 lb 12.8 oz (110.6 kg)   LMP 11/14/2016   BMI 34.98 kg/m   Body mass index is 34.98 kg/m. Patient appears well. ASSESSMENT:  Obesity- responding well to weight loss plan  PLAN:  To continue with current medications. B12 1026mcg/ml injection given, will check metabolic plan and follow up accordingly. RTC in 4 weeks as planned  Gertie Broerman [place], CNM

## 2016-11-30 LAB — COMPREHENSIVE METABOLIC PANEL
ALT: 16 IU/L (ref 0–32)
AST: 15 IU/L (ref 0–40)
Albumin/Globulin Ratio: 1.3 (ref 1.2–2.2)
Albumin: 4.2 g/dL (ref 3.5–5.5)
Alkaline Phosphatase: 93 IU/L (ref 39–117)
BUN / CREAT RATIO: 14 (ref 9–23)
BUN: 12 mg/dL (ref 6–20)
Bilirubin Total: 0.4 mg/dL (ref 0.0–1.2)
CHLORIDE: 102 mmol/L (ref 96–106)
CO2: 22 mmol/L (ref 18–29)
Calcium: 9.8 mg/dL (ref 8.7–10.2)
Creatinine, Ser: 0.84 mg/dL (ref 0.57–1.00)
GFR calc Af Amer: 103 mL/min/{1.73_m2} (ref >59)
GFR calc non Af Amer: 89 mL/min/{1.73_m2} (ref >59)
GLOBULIN, TOTAL: 3.2 g/dL (ref 1.5–4.5)
Glucose: 113 mg/dL — ABNORMAL HIGH (ref 65–99)
Potassium: 4.8 mmol/L (ref 3.5–5.2)
SODIUM: 140 mmol/L (ref 134–144)
Total Protein: 7.4 g/dL (ref 6.0–8.5)

## 2016-11-30 LAB — CBC
HEMOGLOBIN: 12.7 g/dL (ref 11.1–15.9)
Hematocrit: 40 % (ref 34.0–46.6)
MCH: 25.4 pg — AB (ref 26.6–33.0)
MCHC: 31.8 g/dL (ref 31.5–35.7)
MCV: 80 fL (ref 79–97)
PLATELETS: 295 10*3/uL (ref 150–379)
RBC: 5 x10E6/uL (ref 3.77–5.28)
RDW: 15 % (ref 12.3–15.4)
WBC: 15.3 10*3/uL — ABNORMAL HIGH (ref 3.4–10.8)

## 2016-11-30 LAB — B12 AND FOLATE PANEL
Folate: 6.1 ng/mL (ref >3.0)
Vitamin B-12: 441 pg/mL (ref 232–1245)

## 2017-01-01 ENCOUNTER — Ambulatory Visit (INDEPENDENT_AMBULATORY_CARE_PROVIDER_SITE_OTHER): Payer: BLUE CROSS/BLUE SHIELD | Admitting: Obstetrics and Gynecology

## 2017-01-01 MED ORDER — CYANOCOBALAMIN 1000 MCG/ML IJ SOLN
1000.0000 ug | Freq: Once | INTRAMUSCULAR | Status: AC
  Administered 2017-01-01: 1000 ug via INTRAMUSCULAR

## 2017-01-01 NOTE — Progress Notes (Signed)
Pt presents for  Weight,B/P, B-12 injection. No side effects of medications-Phentermine or B-12. Weight loss __2__ lbs. Encourage eating healthy and exercise.Pt is dealing with family health issues. [id number] EXP 01/2018

## 2017-01-29 ENCOUNTER — Encounter: Payer: Self-pay | Admitting: Obstetrics and Gynecology

## 2017-01-29 ENCOUNTER — Ambulatory Visit (INDEPENDENT_AMBULATORY_CARE_PROVIDER_SITE_OTHER): Payer: BLUE CROSS/BLUE SHIELD | Admitting: Obstetrics and Gynecology

## 2017-01-29 VITALS — BP 116/79 | HR 91 | Wt 240.0 lb

## 2017-01-29 DIAGNOSIS — E663 Overweight: Secondary | ICD-10-CM

## 2017-01-29 MED ORDER — CYANOCOBALAMIN 1000 MCG/ML IJ SOLN
1000.0000 ug | Freq: Once | INTRAMUSCULAR | Status: AC
  Administered 2017-01-29: 1000 ug via INTRAMUSCULAR

## 2017-01-29 NOTE — Progress Notes (Signed)
Pt is here for wt, bp check, b- 12 inj, she is doing well, states she hasnt done as well this month- mother in law passed away  Feb 26, 2017 wt- 240.lb 01/01/17 wt- 241lb

## 2017-02-27 ENCOUNTER — Ambulatory Visit (INDEPENDENT_AMBULATORY_CARE_PROVIDER_SITE_OTHER): Payer: BLUE CROSS/BLUE SHIELD | Admitting: Obstetrics and Gynecology

## 2017-02-27 VITALS — BP 112/80 | HR 88 | Wt 241.0 lb

## 2017-02-27 DIAGNOSIS — E663 Overweight: Secondary | ICD-10-CM | POA: Diagnosis not present

## 2017-02-27 MED ORDER — CYANOCOBALAMIN 1000 MCG/ML IJ SOLN
1000.0000 ug | Freq: Once | INTRAMUSCULAR | Status: AC
  Administered 2017-02-27: 1000 ug via INTRAMUSCULAR

## 2017-02-27 NOTE — Progress Notes (Signed)
Pt is here for wt, bp check, b-12 inj, she has been out of the phentermine for several weeks, would like to restart medication  02/27/17 wt-241lb 01/29/17 wt- 240lb

## 2017-03-29 ENCOUNTER — Encounter: Payer: BLUE CROSS/BLUE SHIELD | Admitting: Obstetrics and Gynecology

## 2017-09-06 ENCOUNTER — Encounter: Payer: BLUE CROSS/BLUE SHIELD | Admitting: Obstetrics and Gynecology

## 2017-09-26 ENCOUNTER — Encounter: Payer: Self-pay | Admitting: Obstetrics and Gynecology

## 2017-09-26 ENCOUNTER — Ambulatory Visit (INDEPENDENT_AMBULATORY_CARE_PROVIDER_SITE_OTHER): Payer: BLUE CROSS/BLUE SHIELD | Admitting: Obstetrics and Gynecology

## 2017-09-26 VITALS — BP 121/86 | HR 91 | Ht 70.0 in | Wt 251.8 lb

## 2017-09-26 DIAGNOSIS — Z01419 Encounter for gynecological examination (general) (routine) without abnormal findings: Secondary | ICD-10-CM

## 2017-09-26 NOTE — Progress Notes (Signed)
Subjective:   Elizabeth Lawson is a 37 y.o. G56P0 Caucasian female here for a routine well-woman exam.  Patient's last menstrual period was 09/09/2017.    Current complaints: none PCP: Chrisman         Social History: Sexual: heterosexual Marital Status: married Living situation: with family Occupation: unknown occupation Tobacco/alcohol: no tobacco use Illicit drugs: no history of illicit drug use  The following portions of the patient's history were reviewed and updated as appropriate: allergies, current medications, past family history, past medical history, past social history, past surgical history and problem list.  Past Medical History Past Medical History:  Diagnosis Date  . Dysphagia   . GERD (gastroesophageal reflux disease)   . Vaginal Pap smear, abnormal    ASCUS HR HPV negative (06/14/15)    Past Surgical History Past Surgical History:  Procedure Laterality Date  . coloposcopy  06/14/2015   bx cervix at 6:00 RESULTS: CIN I (Mild Squamous Dysplasia), LGSIL.  ECC: koilocytic atypia consistent with hpv  . ESOPHAGOGASTRODUODENOSCOPY N/A 12/19/2014   Procedure: ESOPHAGOGASTRODUODENOSCOPY (EGD);  Surgeon: Lollie Sails, MD;  Location: [facility] ENDOSCOPY;  Service: Endoscopy;  Laterality: N/A;  . FOREIGN BODY REMOVAL N/A 12/19/2014   Procedure: FOREIGN BODY REMOVAL;  Surgeon: Lollie Sails, MD;  Location: [facility] ENDOSCOPY;  Service: Endoscopy;  Laterality: N/A;  . TUBAL LIGATION Bilateral     Gynecologic History G1P0  Patient's last menstrual period was 09/09/2017. Contraception: tubal ligation Last Pap: 08/2016. Results were: negative, HPV+   Obstetric History OB History  Gravida Para Term Preterm AB Living  1         1  SAB TAB Ectopic Multiple Live Births          1    # Outcome Date GA Lbr Len/2nd Weight Sex Delivery Anes PTL Lv  1 Gravida      Vag-Spont   LIV      Current Medications Current Outpatient Medications on File Prior to Visit  Medication  Sig Dispense Refill  . diphenhydrAMINE (BENADRYL) 25 MG tablet Take 100 mg by mouth daily.    . fexofenadine (ALLEGRA) 180 MG tablet Take 180 mg by mouth daily.    . cyanocobalamin (,VITAMIN B-12,) 1000 MCG/ML injection Inject 1 mL (1,000 mcg total) into the muscle every 30 (thirty) days. (Patient not taking: Reported on 09/26/2017) 1 mL 3  . phentermine 37.5 MG capsule Take 1 capsule (37.5 mg total) by mouth every morning. 30 capsule 1   No current facility-administered medications on file prior to visit.     Review of Systems Patient denies any headaches, blurred vision, shortness of breath, chest pain, abdominal pain, problems with bowel movements, urination, or intercourse.  Objective:  BP 121/86   Pulse 91   Ht 5\' 10"  (1.778 m)   Wt 251 lb 12.8 oz (114.2 kg)   LMP 09/09/2017   BMI 36.13 kg/m  Physical Exam  General:  Well developed, well nourished, no acute distress. She is alert and oriented x3. Skin:  Warm and dry Neck:  Midline trachea, no thyromegaly or nodules Cardiovascular: Regular rate and rhythm, no murmur heard Lungs:  Effort normal, all lung fields clear to auscultation bilaterally Breasts:  No dominant palpable mass, retraction, or nipple discharge Abdomen:  Soft, non tender, no hepatosplenomegaly or masses Pelvic:  External genitalia is normal in appearance.  The vagina is normal in appearance. The cervix is bulbous, no CMT.  Thin prep pap is done with HR HPV cotesting. Uterus is  felt to be normal size, shape, and contour.  No adnexal masses or tenderness noted. Extremities:  No swelling or varicosities noted Psych:  She has a normal mood and affect  Assessment:   Healthy well-woman exam  Plan:   F/U 1 year for AE, or sooner if needed   Rahkim Rabalais Rockney Ghee, CNM

## 2017-09-27 ENCOUNTER — Other Ambulatory Visit: Payer: Self-pay | Admitting: Obstetrics and Gynecology

## 2017-09-27 LAB — LIPID PANEL
CHOL/HDL RATIO: 3.8 ratio (ref 0.0–4.4)
Cholesterol, Total: 186 mg/dL (ref 100–199)
HDL: 49 mg/dL (ref >39)
LDL CALC: 109 mg/dL — AB (ref 0–99)
TRIGLYCERIDES: 141 mg/dL (ref 0–149)
VLDL CHOLESTEROL CAL: 28 mg/dL (ref 5–40)

## 2017-09-27 LAB — COMPREHENSIVE METABOLIC PANEL
A/G RATIO: 1.3 (ref 1.2–2.2)
ALT: 10 IU/L (ref 0–32)
AST: 10 IU/L (ref 0–40)
Albumin: 4.3 g/dL (ref 3.5–5.5)
Alkaline Phosphatase: 80 IU/L (ref 39–117)
BUN/Creatinine Ratio: 13 (ref 9–23)
BUN: 11 mg/dL (ref 6–20)
Bilirubin Total: 0.3 mg/dL (ref 0.0–1.2)
CO2: 24 mmol/L (ref 20–29)
Calcium: 9.3 mg/dL (ref 8.7–10.2)
Chloride: 103 mmol/L (ref 96–106)
Creatinine, Ser: 0.84 mg/dL (ref 0.57–1.00)
GFR calc non Af Amer: 88 mL/min/{1.73_m2} (ref >59)
GFR, EST AFRICAN AMERICAN: 102 mL/min/{1.73_m2} (ref >59)
Globulin, Total: 3.2 g/dL (ref 1.5–4.5)
Glucose: 88 mg/dL (ref 65–99)
Potassium: 4.3 mmol/L (ref 3.5–5.2)
Sodium: 140 mmol/L (ref 134–144)
Total Protein: 7.5 g/dL (ref 6.0–8.5)

## 2017-09-27 LAB — HEMOGLOBIN A1C
Est. average glucose Bld gHb Est-mCnc: 103 mg/dL
HEMOGLOBIN A1C: 5.2 % (ref 4.8–5.6)

## 2017-09-28 ENCOUNTER — Encounter: Payer: BLUE CROSS/BLUE SHIELD | Admitting: Obstetrics and Gynecology

## 2017-09-28 LAB — CYTOLOGY - PAP

## 2018-10-01 ENCOUNTER — Other Ambulatory Visit: Payer: Self-pay

## 2018-10-01 ENCOUNTER — Ambulatory Visit (INDEPENDENT_AMBULATORY_CARE_PROVIDER_SITE_OTHER): Payer: BLUE CROSS/BLUE SHIELD | Admitting: Obstetrics and Gynecology

## 2018-10-01 ENCOUNTER — Encounter: Payer: Self-pay | Admitting: Obstetrics and Gynecology

## 2018-10-01 VITALS — BP 116/84 | HR 92 | Ht 70.0 in | Wt 270.5 lb

## 2018-10-01 DIAGNOSIS — Z8041 Family history of malignant neoplasm of ovary: Secondary | ICD-10-CM

## 2018-10-01 DIAGNOSIS — Z01419 Encounter for gynecological examination (general) (routine) without abnormal findings: Secondary | ICD-10-CM

## 2018-10-01 DIAGNOSIS — Z6838 Body mass index (BMI) 38.0-38.9, adult: Secondary | ICD-10-CM | POA: Diagnosis not present

## 2018-10-01 NOTE — Patient Instructions (Signed)
 Preventive Care 18-39 Years, Female Preventive care refers to lifestyle choices and visits with your health care provider that can promote health and wellness. What does preventive care include?   A yearly physical exam. This is also called an annual well check.  Dental exams once or twice a year.  Routine eye exams. Ask your health care provider how often you should have your eyes checked.  Personal lifestyle choices, including: ? Daily care of your teeth and gums. ? Regular physical activity. ? Eating a healthy diet. ? Avoiding tobacco and drug use. ? Limiting alcohol use. ? Practicing safe sex. ? Taking vitamin and mineral supplements as recommended by your health care provider. What happens during an annual well check? The services and screenings done by your health care provider during your annual well check will depend on your age, overall health, lifestyle risk factors, and family history of disease. Counseling Your health care provider may ask you questions about your:  Alcohol use.  Tobacco use.  Drug use.  Emotional well-being.  Home and relationship well-being.  Sexual activity.  Eating habits.  Work and work environment.  Method of birth control.  Menstrual cycle.  Pregnancy history. Screening You may have the following tests or measurements:  Height, weight, and BMI.  Diabetes screening. This is done by checking your blood sugar (glucose) after you have not eaten for a while (fasting).  Blood pressure.  Lipid and cholesterol levels. These may be checked every 5 years starting at age 20.  Skin check.  Hepatitis C blood test.  Hepatitis B blood test.  Sexually transmitted disease (STD) testing.  BRCA-related cancer screening. This may be done if you have a family history of breast, ovarian, tubal, or peritoneal cancers.  Pelvic exam and Pap test. This may be done every 3 years starting at age 21. Starting at age 30, this may be done  every 5 years if you have a Pap test in combination with an HPV test. Discuss your test results, treatment options, and if necessary, the need for more tests with your health care provider. Vaccines Your health care provider may recommend certain vaccines, such as:  Influenza vaccine. This is recommended every year.  Tetanus, diphtheria, and acellular pertussis (Tdap, Td) vaccine. You may need a Td booster every 10 years.  Varicella vaccine. You may need this if you have not been vaccinated.  HPV vaccine. If you are 26 or younger, you may need three doses over 6 months.  Measles, mumps, and rubella (MMR) vaccine. You may need at least one dose of MMR. You may also need a second dose.  Pneumococcal 13-valent conjugate (PCV13) vaccine. You may need this if you have certain conditions and were not previously vaccinated.  Pneumococcal polysaccharide (PPSV23) vaccine. You may need one or two doses if you smoke cigarettes or if you have certain conditions.  Meningococcal vaccine. One dose is recommended if you are age 19-21 years and a first-year college student living in a residence hall, or if you have one of several medical conditions. You may also need additional booster doses.  Hepatitis A vaccine. You may need this if you have certain conditions or if you travel or work in places where you may be exposed to hepatitis A.  Hepatitis B vaccine. You may need this if you have certain conditions or if you travel or work in places where you may be exposed to hepatitis B.  Haemophilus influenzae type b (Hib) vaccine. You may need this if   you have certain risk factors. Talk to your health care provider about which screenings and vaccines you need and how often you need them. This information is not intended to replace advice given to you by your health care provider. Make sure you discuss any questions you have with your health care provider. Document Released: 08/29/2001 Document Revised:  02/13/2017 Document Reviewed: 05/04/2015 Elsevier Interactive Patient Education  2019 Elsevier Inc.  

## 2018-10-01 NOTE — Progress Notes (Signed)
Subjective:   Elizabeth Lawson is a 38 y.o. G45P0 Caucasian female here for a routine well-woman exam.  No LMP recorded.    Current complaints: none PCP: Criss[facility]       does desire labs  Social History: Sexual: heterosexual Marital Status: married Living situation: with family Occupation: Research scientist (physical sciences) [employer] Tobacco/alcohol: no tobacco use Illicit drugs: no history of illicit drug use  The following portions of the patient's history were reviewed and updated as appropriate: allergies, current medications, past family history, past medical history, past social history, past surgical history and problem list.  Past Medical History Past Medical History:  Diagnosis Date  . Dysphagia   . GERD (gastroesophageal reflux disease)   . Vaginal Pap smear, abnormal    ASCUS HR HPV negative (06/14/15)    Past Surgical History Past Surgical History:  Procedure Laterality Date  . coloposcopy  06/14/2015   bx cervix at 6:00 RESULTS: CIN I (Mild Squamous Dysplasia), LGSIL.  ECC: koilocytic atypia consistent with hpv  . ESOPHAGOGASTRODUODENOSCOPY N/A 12/19/2014   Procedure: ESOPHAGOGASTRODUODENOSCOPY (EGD);  Surgeon: Elizabeth Sails, MD;  Location[facility] ENDOSCOPY;  Service: Endoscopy;  Laterality: N/A;  . FOREIGN BODY REMOVAL N/A 12/19/2014   Procedure: FOREIGN BODY REMOVAL;  Surgeon: Elizabeth Sails, MD;  Location[facility] ENDOSCOPY;  Service: Endoscopy;  Laterality: N/A;  . TUBAL LIGATION Bilateral     Gynecologic History G1P0  No LMP recorded. Contraception: tubal ligation Last Pap: 09/2017. Results were: normal Last mammogram: NA  Obstetric History OB History  Gravida Para Term Preterm AB Living  1         1  SAB TAB Ectopic Multiple Live Births          1    # Outcome Date GA Lbr Len/2nd Weight Sex Delivery Anes PTL Lv  1 Gravida      Vag-Spont   LIV    Current Medications Current Outpatient Medications on File Prior to Visit  Medication Sig Dispense Refill  . cyanocobalamin  (,VITAMIN B-12,) 1000 MCG/ML injection Inject 1 mL (1,000 mcg total) into the muscle every 30 (thirty) days. (Patient not taking: Reported on 09/26/2017) 1 mL 3  . diphenhydrAMINE (BENADRYL) 25 MG tablet Take 100 mg by mouth daily.    . fexofenadine (ALLEGRA) 180 MG tablet Take 180 mg by mouth daily.    . phentermine 37.5 MG capsule Take 1 capsule (37.5 mg total) by mouth every morning. 30 capsule 1   No current facility-administered medications on file prior to visit.     Review of Systems Patient denies any headaches, blurred vision, shortness of breath, chest pain, abdominal pain, problems with bowel movements, urination, or intercourse.  Objective:  BP 116/84   Pulse 92   Ht 5\' 10"  (1.778 m)   Wt 270 lb 8 oz (122.7 kg)   BMI 38.81 kg/m  Physical Exam  General:  Well developed, well nourished, no acute distress. She is alert and oriented x3. Skin:  Warm and dry Neck:  Midline trachea, no thyromegaly or nodules Cardiovascular: Regular rate and rhythm, no murmur heard Lungs:  Effort normal, all lung fields clear to auscultation bilaterally Breasts:  No dominant palpable mass, retraction, or nipple discharge Abdomen:  Soft, non tender, no hepatosplenomegaly or masses Pelvic:  External genitalia is normal in appearance.  The vagina is normal in appearance. The cervix is bulbous, no CMT.  Thin prep pap is not done with HR HPV cotesting. Uterus is felt to be normal size, shape, and contour.  No adnexal masses or tenderness noted. Extremities:  No swelling or varicosities noted Psych:  She has a normal mood and affect  Assessment:   Healthy well-woman exam Obesity/BMI 6 Family history of ovarian cancer(BRAC Negative)  Plan:  Labs obtained- will follow up accordingly. F/U 1 year for AE, or sooner if needed Mammogram needed  [name], CNM

## 2018-10-02 LAB — COMPREHENSIVE METABOLIC PANEL
ALBUMIN: 4.4 g/dL (ref 3.8–4.8)
ALK PHOS: 83 IU/L (ref 39–117)
ALT: 12 IU/L (ref 0–32)
AST: 12 IU/L (ref 0–40)
Albumin/Globulin Ratio: 1.4 (ref 1.2–2.2)
BUN / CREAT RATIO: 13 (ref 9–23)
BUN: 10 mg/dL (ref 6–20)
Bilirubin Total: 0.3 mg/dL (ref 0.0–1.2)
CHLORIDE: 103 mmol/L (ref 96–106)
CO2: 21 mmol/L (ref 20–29)
Calcium: 9.5 mg/dL (ref 8.7–10.2)
Creatinine, Ser: 0.77 mg/dL (ref 0.57–1.00)
GFR calc Af Amer: 112 mL/min/{1.73_m2} (ref >59)
GFR calc non Af Amer: 98 mL/min/{1.73_m2} (ref >59)
Globulin, Total: 3.2 g/dL (ref 1.5–4.5)
Glucose: 91 mg/dL (ref 65–99)
Potassium: 4.3 mmol/L (ref 3.5–5.2)
Sodium: 139 mmol/L (ref 134–144)
TOTAL PROTEIN: 7.6 g/dL (ref 6.0–8.5)

## 2018-10-02 LAB — TSH: TSH: 2.43 u[IU]/mL (ref 0.450–4.500)

## 2018-10-02 LAB — LIPID PANEL
CHOLESTEROL TOTAL: 180 mg/dL (ref 100–199)
Chol/HDL Ratio: 3.8 ratio (ref 0.0–4.4)
HDL: 48 mg/dL (ref >39)
LDL CALC: 114 mg/dL — AB (ref 0–99)
Triglycerides: 92 mg/dL (ref 0–149)
VLDL CHOLESTEROL CAL: 18 mg/dL (ref 5–40)

## 2018-10-02 LAB — HEMOGLOBIN A1C
Est. average glucose Bld gHb Est-mCnc: 108 mg/dL
HEMOGLOBIN A1C: 5.4 % (ref 4.8–5.6)

## 2019-02-07 ENCOUNTER — Ambulatory Visit
Admission: RE | Admit: 2019-02-07 | Discharge: 2019-02-07 | Disposition: A | Discharge status: Home / Self Care | Payer: BLUE CROSS/BLUE SHIELD | Source: Ambulatory Visit | Attending: Obstetrics and Gynecology | Admitting: Obstetrics and Gynecology

## 2019-02-07 DIAGNOSIS — Z1231 Encounter for screening mammogram for malignant neoplasm of breast: Secondary | ICD-10-CM | POA: Insufficient documentation

## 2019-02-07 DIAGNOSIS — Z01419 Encounter for gynecological examination (general) (routine) without abnormal findings: Secondary | ICD-10-CM | POA: Insufficient documentation

## 2019-02-10 ENCOUNTER — Other Ambulatory Visit: Payer: Self-pay | Admitting: Obstetrics and Gynecology

## 2019-02-10 DIAGNOSIS — N6489 Other specified disorders of breast: Secondary | ICD-10-CM

## 2019-02-10 DIAGNOSIS — R928 Other abnormal and inconclusive findings on diagnostic imaging of breast: Secondary | ICD-10-CM

## 2019-02-10 DIAGNOSIS — N6320 Unspecified lump in the left breast, unspecified quadrant: Secondary | ICD-10-CM

## 2019-02-14 ENCOUNTER — Ambulatory Visit
Admission: RE | Admit: 2019-02-14 | Discharge: 2019-02-14 | Disposition: A | Discharge status: Home / Self Care | Payer: BLUE CROSS/BLUE SHIELD | Source: Ambulatory Visit | Attending: Obstetrics and Gynecology | Admitting: Obstetrics and Gynecology

## 2019-02-14 DIAGNOSIS — N6489 Other specified disorders of breast: Secondary | ICD-10-CM | POA: Diagnosis present

## 2019-02-14 DIAGNOSIS — R928 Other abnormal and inconclusive findings on diagnostic imaging of breast: Secondary | ICD-10-CM | POA: Diagnosis present

## 2019-02-14 DIAGNOSIS — N6320 Unspecified lump in the left breast, unspecified quadrant: Secondary | ICD-10-CM | POA: Diagnosis present

## 2019-02-17 ENCOUNTER — Other Ambulatory Visit: Payer: Self-pay | Admitting: Obstetrics and Gynecology

## 2019-02-17 DIAGNOSIS — N6002 Solitary cyst of left breast: Secondary | ICD-10-CM

## 2019-02-17 DIAGNOSIS — R928 Other abnormal and inconclusive findings on diagnostic imaging of breast: Secondary | ICD-10-CM

## 2019-02-24 ENCOUNTER — Ambulatory Visit
Admission: RE | Admit: 2019-02-24 | Discharge: 2019-02-24 | Disposition: A | Discharge status: Home / Self Care | Payer: BLUE CROSS/BLUE SHIELD | Source: Ambulatory Visit | Attending: Obstetrics and Gynecology | Admitting: Obstetrics and Gynecology

## 2019-02-24 ENCOUNTER — Other Ambulatory Visit: Payer: Self-pay

## 2019-02-24 DIAGNOSIS — N6002 Solitary cyst of left breast: Secondary | ICD-10-CM | POA: Diagnosis present

## 2019-02-24 DIAGNOSIS — R928 Other abnormal and inconclusive findings on diagnostic imaging of breast: Secondary | ICD-10-CM

## 2019-02-24 HISTORY — PX: BREAST BIOPSY: SHX20

## 2019-02-25 LAB — SURGICAL PATHOLOGY

## 2019-03-17 ENCOUNTER — Other Ambulatory Visit: Payer: Self-pay

## 2019-03-17 ENCOUNTER — Ambulatory Visit: Payer: BLUE CROSS/BLUE SHIELD | Admitting: Surgery

## 2019-03-17 ENCOUNTER — Encounter: Payer: Self-pay | Admitting: *Deleted

## 2019-03-17 ENCOUNTER — Encounter: Payer: Self-pay | Admitting: Surgery

## 2019-03-17 VITALS — BP 132/85 | HR 84 | Temp 97.9°F | Resp 16 | Ht 70.0 in | Wt 264.0 lb

## 2019-03-17 DIAGNOSIS — D242 Benign neoplasm of left breast: Secondary | ICD-10-CM | POA: Diagnosis not present

## 2019-03-17 NOTE — Patient Instructions (Addendum)
We will schedule your surgery.   Lumpectomy, Care After This sheet gives you information about how to care for yourself after your procedure. Your health care provider may also give you more specific instructions. If you have problems or questions, contact your health care provider. What can I expect after the procedure? After the procedure, it is common to have:  Breast swelling.  Breast tenderness.  Stiffness in your arm or shoulder.  A change in the shape and feel of your breast.  Scar tissue that feels hard to the touch in the area where the lump was removed. Follow these instructions at home: Medicines  Take over-the-counter and prescription medicines only as told by your health care provider.  Take your antibiotic medicine as told by your health care provider. Do not stop taking the antibiotic even if you start to feel better.  If you are taking prescription pain medicine, take actions to prevent or treat constipation. Your health care provider may recommend that you: ? Drink enough fluid to keep your urine pale yellow. ? Eat foods that are high in fiber, such as fresh fruits and vegetables, whole grains, and beans. ? Limit foods that are high in fat and processed sugars, such as fried and sweet foods. ? Take an over-the-counter or prescription medicine for constipation. Bathing  Do not take baths, swim, or use a hot tub until your health care provider approves. Ask your health care provider if you may take showers. You may only be allowed to take sponge baths. Incision care      Follow instructions from your health care provider about how to take care of your incision. Make sure you: ? Wash your hands with soap and water before you change your bandage (dressing). If soap and water are not available, use hand sanitizer. ? Change your dressing as told by your health care provider. ? Leave stitches (sutures), skin glue, or adhesive strips in place. These skin closures may  need to stay in place for 2 weeks or longer. If adhesive strip edges start to loosen and curl up, you may trim the loose edges. Do not remove adhesive strips completely unless your health care provider tells you to do that.  Check your incision area every day for signs of infection. Check for: ? Redness, swelling, or pain. ? Fluid or blood. ? Warmth. ? Pus or a bad smell.  Keep your dressing clean and dry.  If you were sent home with a surgical drain in place, follow instructions from your health care provider about emptying it. Activity  Return to your normal activities as told by your health care provider. Ask your health care provider what activities are safe for you.  Be careful to avoid any activities that could cause an injury to your arm on the side of your surgery.  Do not lift anything that is heavier than 10 lb (4.5 kg), or the limit that you are told, until your health care provider says that it is safe. Avoid lifting with the arm that is on the side of your surgery.  Do not carry heavy objects on your shoulder on the side of your surgery.  After your drain is removed, you should perform exercises to keep your arm from getting stiff and swollen. Talk with your health care provider about which exercises are safe for you. General instructions  Do not drive or use heavy machinery while taking prescription pain medicine.  Wear a supportive bra as told by your health care  provider.  Raise (elevate) your arm above the level of your heart while you are sitting or lying down.  Do not wear tight jewelry on your arm, wrist, or fingers on the side of your surgery.  Keep all follow-up visits as told by your health care provider. This is important. ? You may need to be screened for extra fluid around the lymph nodes (lymphedema). Follow instructions from your health care provider about how often you should be checked.  If you had any lymph nodes removed during your procedure, be sure  to tell all of your health care providers. This is important information to share before you are involved in certain procedures, such as having blood tests or having your blood pressure taken. Contact a health care provider if:  You develop a rash.  You have a fever.  Your pain medicine is not working.  Your swelling, weakness, or numbness in your arm has not improved after a few weeks.  You have new swelling in your breast or arm.  You have redness, swelling, or pain in your incision area.  You have fluid or blood coming from your incision.  Your incision feels warm to the touch.  You have pus or a bad smell coming from your incision. Get help right away if:  You have very bad pain in your breast or arm.  You have chest pain.  You have difficulty breathing. Summary  After the procedure, it is common to have breast tenderness, swelling, and stiffness in your arm and shoulder.  Follow instructions from your health care provider about how to take care of your incision.  Do not lift anything that is heavier than 10 lb (4.5 kg), or the limit that you are told, until your health care provider says that it is safe. Avoid lifting with the arm that is on the side of your surgery.  If you had any lymph nodes removed during your procedure, be sure to tell all of your health care providers. This is important information to share before you are involved in certain procedures, such as having blood tests or having your blood pressure taken. This information is not intended to replace advice given to you by your health care provider. Make sure you discuss any questions you have with your health care provider. Document Released: 07/19/2006 Document Revised: 10/11/2018 Document Reviewed: 03/15/2016 Elsevier Patient Education  2020 [place] self-awareness is knowing how your breasts look and feel. Doing breast self-awareness is important. It allows you  to catch a breast problem early while it is still small and can be treated. All women should do breast self-awareness, including women who have had breast implants. Tell your doctor if you notice a change in your breasts. What you need:  A mirror.  A well-lit room. How to do a breast self-exam A breast self-exam is one way to learn what is normal for your breasts and to check for changes. To do a breast self-exam: Look for changes  1. Take off all the clothes above your waist. 2. Stand in front of a mirror in a room with good lighting. 3. Put your hands on your hips. 4. Push your hands down. 5. Look at your breasts and nipples in the mirror to see if one breast or nipple looks different from the other. Check to see if: ? The shape of one breast is different. ? The size of one breast is different. ? There are wrinkles,  dips, and bumps in one breast and not the other. 6. Look at each breast for changes in the skin, such as: ? Redness. ? Scaly areas. 7. Look for changes in your nipples, such as: ? Liquid around the nipples. ? Bleeding. ? Dimpling. ? Redness. ? A change in where the nipples are. Feel for changes  1. Lie on your back on the floor. 2. Feel each breast. To do this, follow these steps: ? Pick a breast to feel. ? Put the arm closest to that breast above your head. ? Use your other arm to feel the nipple area of your breast. Feel the area with the pads of your three middle fingers by making small circles with your fingers. For the first circle, press lightly. For the second circle, press harder. For the third circle, press even harder. ? Keep making circles with your fingers at the different pressures as you move down your breast. Stop when you feel your ribs. ? Move your fingers a little toward the center of your body. ? Start making circles with your fingers again, this time going up until you reach your collarbone. ? Keep making up-and-down circles until you reach your  armpit. Remember to keep using the three pressures. ? Feel the other breast in the same way. 3. Sit or stand in the tub or shower. 4. With soapy water on your skin, feel each breast the same way you did in step 2 when you were lying on the floor. Write down what you find Writing down what you find can help you remember what to tell your doctor. Write down:  What is normal for each breast.  Any changes you find in each breast, including: ? The kind of changes you find. ? Whether you have pain. ? Size and location of any lumps.  When you last had your menstrual period. General tips  Check your breasts every month.  If you are breastfeeding, the best time to check your breasts is after you feed your baby or after you use a breast pump.  If you get menstrual periods, the best time to check your breasts is 5-7 days after your menstrual period is over.  With time, you will become comfortable with the self-exam, and you will begin to know if there are changes in your breasts. Contact a doctor if you:  See a change in the shape or size of your breasts or nipples.  See a change in the skin of your breast or nipples, such as red or scaly skin.  Have fluid coming from your nipples that is not normal.  Find a lump or thick area that was not there before.  Have pain in your breasts.  Have any concerns about your breast health. Summary  Breast self-awareness includes looking for changes in your breasts, as well as feeling for changes within your breasts.  Breast self-awareness should be done in front of a mirror in a well-lit room.  You should check your breasts every month. If you get menstrual periods, the best time to check your breasts is 5-7 days after your menstrual period is over.  Let your doctor know of any changes you see in your breasts, including changes in size, changes on the skin, pain or tenderness, or fluid from your nipples that is not normal. This information is not  intended to replace advice given to you by your health care provider. Make sure you discuss any questions you have with your health care provider.  Document Released: 12/20/2007 Document Revised: 02/19/2018 Document Reviewed: 02/19/2018 Elsevier Patient Education  2020 [employer].

## 2019-03-17 NOTE — Progress Notes (Signed)
Patient's surgery to be scheduled for 03-28-19 at [facility] with Dr. Dahlia Byes. Patient will be contacted with arrival time once coordinated with the mammography department.   The patient is aware to have COVID-19 testing done on 03-25-19 at the [facility] drive thru ([id number] [place]) between 8:00 am and 10:30 am. She is aware to isolate after, have no visitors, wash hands frequently, and avoid touching face.   The patient is aware she will be contacted by the [facility] to complete a phone interview sometime in the near future.  Patient aware to be NPO after midnight and have a driver.   The patient verbalizes understanding of the above.   The patient is aware to call the office should she have further questions.

## 2019-03-18 ENCOUNTER — Other Ambulatory Visit: Payer: Self-pay | Admitting: Surgery

## 2019-03-18 DIAGNOSIS — D242 Benign neoplasm of left breast: Secondary | ICD-10-CM

## 2019-03-18 HISTORY — PX: BREAST LUMPECTOMY: SHX2

## 2019-03-19 ENCOUNTER — Encounter: Payer: Self-pay | Admitting: Surgery

## 2019-03-19 ENCOUNTER — Other Ambulatory Visit: Payer: Self-pay

## 2019-03-19 ENCOUNTER — Encounter
Admission: RE | Admit: 2019-03-19 | Discharge: 2019-03-19 | Disposition: A | Discharge status: Home / Self Care | Payer: BLUE CROSS/BLUE SHIELD | Source: Ambulatory Visit | Attending: Surgery | Admitting: Surgery

## 2019-03-19 HISTORY — DX: Other allergic rhinitis: J30.89

## 2019-03-19 NOTE — Progress Notes (Signed)
Patient ID: Elizabeth Lawson, female   DOB: March 03, 1981, 38 y.o.   MRN: [id number]  HPI Elizabeth Lawson is a 38 y.o. female seen in consultation at the request of Dr. Luvenia Heller.  She reports some intermittent left breast pain.  This has been going on for several weeks.  It is very mild and she described as discomfort.  She did have a mammogram and ultrasound that I have personally reviewed showing evidence of small retroareolar 6:00 o'clock complex cystic nodule.  She recently underwent biopsy evidence of showing evidence of fibrosis no evidence of atypia or ADH.  They cannot exclude an intraductal papilloma.  The patient states that she wants peace of mind.  HPI  Past Medical History:  Diagnosis Date  . Dysphagia   . GERD (gastroesophageal reflux disease)   . Vaginal Pap smear, abnormal    ASCUS HR HPV negative (06/14/15)    Past Surgical History:  Procedure Laterality Date  . BREAST BIOPSY Left 02/24/2019   u/s bx path pending  . coloposcopy  06/14/2015   bx cervix at 6:00 RESULTS: CIN I (Mild Squamous Dysplasia), LGSIL.  ECC: koilocytic atypia consistent with hpv  . ESOPHAGOGASTRODUODENOSCOPY N/A 12/19/2014   Procedure: ESOPHAGOGASTRODUODENOSCOPY (EGD);  Surgeon: Lollie Sails, MD;  Location: [facility] ENDOSCOPY;  Service: Endoscopy;  Laterality: N/A;  . FOREIGN BODY REMOVAL N/A 12/19/2014   Procedure: FOREIGN BODY REMOVAL;  Surgeon: Lollie Sails, MD;  Location: [facility] ENDOSCOPY;  Service: Endoscopy;  Laterality: N/A;  . TUBAL LIGATION Bilateral     Family History  Problem Relation Age of Onset  . Hypertension Father   . Melanoma Father   . Depression Father   . Ulcerative colitis Father   . Ovarian cancer Mother        small cell, stage 1  . Hypertension Paternal Grandfather   . Heart disease Paternal Grandfather   . Breast cancer Other   . Ovarian cancer Other   . Lung cancer Other     Social History Social History   Tobacco Use  . Smoking status: Never Smoker  .  Smokeless tobacco: Never Used  Substance Use Topics  . Alcohol use: No  . Drug use: No    No Known Allergies  Current Outpatient Medications  Medication Sig Dispense Refill  . diphenhydrAMINE (BENADRYL) 25 MG tablet Take 100 mg by mouth daily.    . fexofenadine (ALLEGRA) 180 MG tablet Take 180 mg by mouth daily.     No current facility-administered medications for this visit.      Review of Systems Full ROS  was asked and was negative except for the information on the HPI  Physical Exam Blood pressure 132/85, pulse 84, temperature 97.9 F (36.6 C), temperature source Temporal, resp. rate 16, height 5\' 10"  (1.778 m), weight 264 lb (119.7 kg), last menstrual period 03/16/2019, SpO2 97 %. CONSTITUTIONAL: NAD EYES: Pupils are equal, round, and reactive to light, Sclera are non-icteric. EARS, NOSE, MOUTH AND THROAT: The oropharynx is clear. The oral mucosa is pink and moist. Hearing is intact to voice. LYMPH NODES:  Lymph nodes in the neck are normal. RESPIRATORY:  Lungs are clear. There is normal respiratory effort, with equal breath sounds bilaterally, and without pathologic use of accessory muscles. BREAST: There is evidence of previous small biopsy site on the left side.  There is no evidence of any palpable abnormalities.  There is no evidence of any nipple or skin changes.  No evidence of lymphadenopathy in either breast. CARDIOVASCULAR:  Heart is regular without murmurs, gallops, or rubs. GI: The abdomen is soft, nontender, and nondistended. There are no palpable masses. There is no hepatosplenomegaly. There are normal bowel sounds in all quadrants. GU: Rectal deferred.   MUSCULOSKELETAL: Normal muscle strength and tone. No cyanosis or edema.   SKIN: Turgor is good and there are no pathologic skin lesions or ulcers. NEUROLOGIC: Motor and sensation is grossly normal. Cranial nerves are grossly intact. PSYCH:  Oriented to person, place and time. Affect is normal.  Data  Reviewed  I have personally reviewed the patient's imaging, laboratory findings and medical records.    Assessment/Plan 38 year old female with left breast benign mass consistent with intraductal papilloma.  I had an extensive discussion with the patient regarding her disease process and she wishes to proceed with Central duct exesion/ central lumpectomy.  She wants peace of mind and wants to make sure there is nothing else that can potentially transform into a malignancy.  Procedure discussed with the patient in detail.  Risk benefit and possible complications including but not limited to: Bleeding, infection breast deformity, great in pathology diagnosis.  She understands and she wishes to proceed. I will discuss the case with our breast radiologist and figure out if needle guided is warranted at this time.  I will anticipate that this might be difficult given the retroareolar position. A copy of this report was sent to the referring provider  Caroleen Hamman, MD FACS General Surgeon 03/19/2019, 9:45 AM

## 2019-03-19 NOTE — Pre-Procedure Instructions (Signed)
Called Dr Corlis Leak office for orders.

## 2019-03-19 NOTE — Patient Instructions (Signed)
Your procedure is scheduled on: 03/28/2019 Fri Come to [facility] at instructed time.  Remeber instructions that are not followed completely may result in serious medical risk, up to and including death, or upon the discretion of your surgeon and anesthesiologist your surgery may need to be rescheduled.    _x___ 1. Do not eat food after midnight the night before your procedure. You may drink clear liquids up to 2 hours before you are scheduled to arrive at the hospital for your procedure.  Do not drink clear liquids within 2 hours of your scheduled arrival to the hospital.  Clear liquids include  --Water or Apple juice without pulp  --Clear carbohydrate beverage such as ClearFast or Gatorade  --Black Coffee or Clear Tea (No milk, no creamers, do not add anything to                  the coffee or Tea Type 1 and type 2 diabetics should only drink water.   ____Ensure clear carbohydrate drink on the way to the hospital for bariatric patients  ____Ensure clear carbohydrate drink 3 hours before surgery.   No gum chewing or hard candies.     __x__ 2. No Alcohol for 24 hours before or after surgery.   __x__3. No Smoking or e-cigarettes for 24 prior to surgery.  Do not use any chewable tobacco products for at least 6 hour prior to surgery   ____  4. Bring all medications with you on the day of surgery if instructed.    __x__ 5. Notify your doctor if there is any change in your medical condition     (cold, fever, infections).    x___6. On the morning of surgery brush your teeth with toothpaste and water.  You may rinse your mouth with mouth wash if you wish.  Do not swallow any toothpaste or mouthwash.   Do not wear jewelry, make-up, hairpins, clips or nail polish.  Do not wear lotions, powders, or perfumes. You may wear deodorant.  Do not shave 48 hours prior to surgery. Men may shave face and neck.  Do not bring valuables to the hospital.    [facility] is not responsible for  any belongings or valuables.               Contacts, dentures or bridgework may not be worn into surgery.  Leave your suitcase in the car. After surgery it may be brought to your room.  For patients admitted to the hospital, discharge time is determined by your                       treatment team.  _  Patients discharged the day of surgery will not be allowed to drive home.  You will need someone to drive you home and stay with you the night of your procedure.    Please read over the following fact sheets that you were given:   [facility] Preparing for Surgery and or MRSA Information   _x___ Take anti-hypertensive listed below, cardiac, seizure, asthma,     anti-reflux and psychiatric medicines. These include:  1. diphenhydrAMINE (BENADRYL) 25 MG tablet  2.fexofenadine (ALLEGRA) 180 MG tablet  3.  4.  5.  6.  ____Fleets enema or Magnesium Citrate as directed.   _x___ Use CHG Soap or sage wipes as directed on instruction sheet   ____ Use inhalers on the day of surgery and bring to hospital day of surgery  ____  Stop Metformin and Janumet 2 days prior to surgery.    ____ Take 1/2 of usual insulin dose the night before surgery and none on the morning     surgery.   _x___ Follow recommendations from Cardiologist, Pulmonologist or PCP regarding          stopping Aspirin, Coumadin, Plavix ,Eliquis, Effient, or Pradaxa, and Pletal.  X____Stop Anti-inflammatories such as Advil, Aleve, Ibuprofen, Motrin, Naproxen, Naprosyn, Goodies powders or aspirin products. OK to take Tylenol and                          Celebrex.   _x___ Stop supplements until after surgery.  But may continue Vitamin D, Vitamin B,       and multivitamin.   ____ Bring C-Pap to the hospital.

## 2019-03-19 NOTE — H&P (View-Only) (Signed)
Patient ID: Elizabeth Lawson, female   DOB: 1980-11-07, 38 y.o.   MRN: [id number]  HPI Elizabeth Lawson is a 38 y.o. female seen in consultation at the request of Dr. Luvenia Heller.  She reports some intermittent left breast pain.  This has been going on for several weeks.  It is very mild and she described as discomfort.  She did have a mammogram and ultrasound that I have personally reviewed showing evidence of small retroareolar 6:00 o'clock complex cystic nodule.  She recently underwent biopsy evidence of showing evidence of fibrosis no evidence of atypia or ADH.  They cannot exclude an intraductal papilloma.  The patient states that she wants peace of mind.  HPI  Past Medical History:  Diagnosis Date  . Dysphagia   . GERD (gastroesophageal reflux disease)   . Vaginal Pap smear, abnormal    ASCUS HR HPV negative (06/14/15)    Past Surgical History:  Procedure Laterality Date  . BREAST BIOPSY Left 02/24/2019   u/s bx path pending  . coloposcopy  06/14/2015   bx cervix at 6:00 RESULTS: CIN I (Mild Squamous Dysplasia), LGSIL.  ECC: koilocytic atypia consistent with hpv  . ESOPHAGOGASTRODUODENOSCOPY N/A 12/19/2014   Procedure: ESOPHAGOGASTRODUODENOSCOPY (EGD);  Surgeon: Lollie Sails, MD;  Location: [facility] ENDOSCOPY;  Service: Endoscopy;  Laterality: N/A;  . FOREIGN BODY REMOVAL N/A 12/19/2014   Procedure: FOREIGN BODY REMOVAL;  Surgeon: Lollie Sails, MD;  Location: [facility] ENDOSCOPY;  Service: Endoscopy;  Laterality: N/A;  . TUBAL LIGATION Bilateral     Family History  Problem Relation Age of Onset  . Hypertension Father   . Melanoma Father   . Depression Father   . Ulcerative colitis Father   . Ovarian cancer Mother        small cell, stage 1  . Hypertension Paternal Grandfather   . Heart disease Paternal Grandfather   . Breast cancer Other   . Ovarian cancer Other   . Lung cancer Other     Social History Social History   Tobacco Use  . Smoking status: Never Smoker  .  Smokeless tobacco: Never Used  Substance Use Topics  . Alcohol use: No  . Drug use: No    No Known Allergies  Current Outpatient Medications  Medication Sig Dispense Refill  . diphenhydrAMINE (BENADRYL) 25 MG tablet Take 100 mg by mouth daily.    . fexofenadine (ALLEGRA) 180 MG tablet Take 180 mg by mouth daily.     No current facility-administered medications for this visit.      Review of Systems Full ROS  was asked and was negative except for the information on the HPI  Physical Exam Blood pressure 132/85, pulse 84, temperature 97.9 F (36.6 C), temperature source Temporal, resp. rate 16, height 5\' 10"  (1.778 m), weight 264 lb (119.7 kg), last menstrual period 03/16/2019, SpO2 97 %. CONSTITUTIONAL: NAD EYES: Pupils are equal, round, and reactive to light, Sclera are non-icteric. EARS, NOSE, MOUTH AND THROAT: The oropharynx is clear. The oral mucosa is pink and moist. Hearing is intact to voice. LYMPH NODES:  Lymph nodes in the neck are normal. RESPIRATORY:  Lungs are clear. There is normal respiratory effort, with equal breath sounds bilaterally, and without pathologic use of accessory muscles. BREAST: There is evidence of previous small biopsy site on the left side.  There is no evidence of any palpable abnormalities.  There is no evidence of any nipple or skin changes.  No evidence of lymphadenopathy in either breast. CARDIOVASCULAR:  Heart is regular without murmurs, gallops, or rubs. GI: The abdomen is soft, nontender, and nondistended. There are no palpable masses. There is no hepatosplenomegaly. There are normal bowel sounds in all quadrants. GU: Rectal deferred.   MUSCULOSKELETAL: Normal muscle strength and tone. No cyanosis or edema.   SKIN: Turgor is good and there are no pathologic skin lesions or ulcers. NEUROLOGIC: Motor and sensation is grossly normal. Cranial nerves are grossly intact. PSYCH:  Oriented to person, place and time. Affect is normal.  Data  Reviewed  I have personally reviewed the patient's imaging, laboratory findings and medical records.    Assessment/Plan 38 year old female with left breast benign mass consistent with intraductal papilloma.  I had an extensive discussion with the patient regarding her disease process and she wishes to proceed with Central duct exesion/ central lumpectomy.  She wants peace of mind and wants to make sure there is nothing else that can potentially transform into a malignancy.  Procedure discussed with the patient in detail.  Risk benefit and possible complications including but not limited to: Bleeding, infection breast deformity, great in pathology diagnosis.  She understands and she wishes to proceed. I will discuss the case with our breast radiologist and figure out if needle guided is warranted at this time.  I will anticipate that this might be difficult given the retroareolar position. A copy of this report was sent to the referring provider  Caroleen Hamman, MD FACS General Surgeon 03/19/2019, 9:45 AM

## 2019-03-25 ENCOUNTER — Other Ambulatory Visit
Admission: RE | Admit: 2019-03-25 | Discharge: 2019-03-25 | Disposition: A | Discharge status: Home / Self Care | Payer: BLUE CROSS/BLUE SHIELD | Source: Ambulatory Visit | Attending: Surgery | Admitting: Surgery

## 2019-03-25 ENCOUNTER — Other Ambulatory Visit: Payer: Self-pay

## 2019-03-25 DIAGNOSIS — Z20828 Contact with and (suspected) exposure to other viral communicable diseases: Secondary | ICD-10-CM | POA: Insufficient documentation

## 2019-03-25 DIAGNOSIS — Z01812 Encounter for preprocedural laboratory examination: Secondary | ICD-10-CM | POA: Diagnosis not present

## 2019-03-25 LAB — SARS CORONAVIRUS 2 (TAT 6-24 HRS): SARS Coronavirus 2: NEGATIVE

## 2019-03-27 MED ORDER — CEFAZOLIN SODIUM-DEXTROSE 2-4 GM/100ML-% IV SOLN
2.0000 g | INTRAVENOUS | Status: AC
  Administered 2019-03-28: 2 g via INTRAVENOUS

## 2019-03-28 ENCOUNTER — Encounter
Admission: RE | Disposition: A | Discharge status: Home / Self Care | Payer: Self-pay | Source: Home / Self Care | Attending: Surgery

## 2019-03-28 ENCOUNTER — Ambulatory Visit: Payer: BLUE CROSS/BLUE SHIELD | Admitting: Certified Registered"

## 2019-03-28 ENCOUNTER — Ambulatory Visit
Admission: RE | Admit: 2019-03-28 | Discharge: 2019-03-28 | Disposition: A | Discharge status: Home / Self Care | Payer: BLUE CROSS/BLUE SHIELD | Source: Ambulatory Visit | Attending: Surgery | Admitting: Surgery

## 2019-03-28 ENCOUNTER — Ambulatory Visit
Admission: RE | Admit: 2019-03-28 | Discharge: 2019-03-28 | Disposition: A | Discharge status: Home / Self Care | Payer: BLUE CROSS/BLUE SHIELD | Attending: Surgery | Admitting: Surgery

## 2019-03-28 ENCOUNTER — Encounter: Payer: Self-pay | Admitting: *Deleted

## 2019-03-28 DIAGNOSIS — D242 Benign neoplasm of left breast: Secondary | ICD-10-CM

## 2019-03-28 DIAGNOSIS — N6082 Other benign mammary dysplasias of left breast: Secondary | ICD-10-CM | POA: Diagnosis not present

## 2019-03-28 DIAGNOSIS — N6012 Diffuse cystic mastopathy of left breast: Secondary | ICD-10-CM | POA: Diagnosis not present

## 2019-03-28 DIAGNOSIS — N644 Mastodynia: Secondary | ICD-10-CM | POA: Insufficient documentation

## 2019-03-28 HISTORY — PX: PARTIAL MASTECTOMY WITH NEEDLE LOCALIZATION: SHX6008

## 2019-03-28 HISTORY — PX: BREAST EXCISIONAL BIOPSY: SUR124

## 2019-03-28 SURGERY — PARTIAL MASTECTOMY WITH NEEDLE LOCALIZATION
Anesthesia: General | Laterality: Left

## 2019-03-28 MED ORDER — LIDOCAINE HCL (PF) 2 % IJ SOLN
INTRAMUSCULAR | Status: AC
  Filled 2019-03-28: qty 10

## 2019-03-28 MED ORDER — GABAPENTIN 300 MG PO CAPS
300.0000 mg | ORAL_CAPSULE | ORAL | Status: AC
  Administered 2019-03-28: 300 mg via ORAL

## 2019-03-28 MED ORDER — MIDAZOLAM HCL 2 MG/2ML IJ SOLN
INTRAMUSCULAR | Status: AC
  Filled 2019-03-28: qty 2

## 2019-03-28 MED ORDER — PROMETHAZINE HCL 25 MG/ML IJ SOLN
INTRAMUSCULAR | Status: AC
  Administered 2019-03-28: 6.25 mg via INTRAVENOUS
  Filled 2019-03-28: qty 1

## 2019-03-28 MED ORDER — CHLORHEXIDINE GLUCONATE CLOTH 2 % EX PADS: 6.0000 | MEDICATED_PAD | Freq: Once | CUTANEOUS | Status: DC

## 2019-03-28 MED ORDER — CELECOXIB 200 MG PO CAPS
ORAL_CAPSULE | ORAL | Status: AC
  Filled 2019-03-28: qty 1

## 2019-03-28 MED ORDER — ROCURONIUM BROMIDE 50 MG/5ML IV SOLN
INTRAVENOUS | Status: AC
  Filled 2019-03-28: qty 1

## 2019-03-28 MED ORDER — EPHEDRINE SULFATE 50 MG/ML IJ SOLN
INTRAMUSCULAR | Status: AC
  Filled 2019-03-28: qty 1

## 2019-03-28 MED ORDER — ONDANSETRON HCL 4 MG/2ML IJ SOLN
4.0000 mg | Freq: Once | INTRAMUSCULAR | Status: AC | PRN
  Administered 2019-03-28: 4 mg via INTRAVENOUS

## 2019-03-28 MED ORDER — ONDANSETRON HCL 4 MG/2ML IJ SOLN
INTRAMUSCULAR | Status: AC
  Filled 2019-03-28: qty 2

## 2019-03-28 MED ORDER — ROCURONIUM BROMIDE 100 MG/10ML IV SOLN
INTRAVENOUS | Status: DC | PRN
  Administered 2019-03-28: 10 mg via INTRAVENOUS
  Administered 2019-03-28: 40 mg via INTRAVENOUS

## 2019-03-28 MED ORDER — GLYCOPYRROLATE 0.2 MG/ML IJ SOLN
INTRAMUSCULAR | Status: DC | PRN
  Administered 2019-03-28: 0.2 mg via INTRAVENOUS

## 2019-03-28 MED ORDER — FENTANYL CITRATE (PF) 100 MCG/2ML IJ SOLN
25.0000 ug | INTRAMUSCULAR | Status: DC | PRN
  Administered 2019-03-28 (×2): 25 ug via INTRAVENOUS

## 2019-03-28 MED ORDER — PROPOFOL 10 MG/ML IV BOLUS
INTRAVENOUS | Status: DC | PRN
  Administered 2019-03-28: 200 mg via INTRAVENOUS

## 2019-03-28 MED ORDER — LACTATED RINGERS IV SOLN: INTRAVENOUS | Status: DC

## 2019-03-28 MED ORDER — EPHEDRINE SULFATE 50 MG/ML IJ SOLN
INTRAMUSCULAR | Status: DC | PRN
  Administered 2019-03-28: 5 mg via INTRAVENOUS

## 2019-03-28 MED ORDER — CELECOXIB 200 MG PO CAPS
200.0000 mg | ORAL_CAPSULE | ORAL | Status: AC
  Administered 2019-03-28: 200 mg via ORAL

## 2019-03-28 MED ORDER — FAMOTIDINE 20 MG PO TABS
ORAL_TABLET | ORAL | Status: AC
  Filled 2019-03-28: qty 1

## 2019-03-28 MED ORDER — GABAPENTIN 300 MG PO CAPS
ORAL_CAPSULE | ORAL | Status: AC
  Filled 2019-03-28: qty 1

## 2019-03-28 MED ORDER — ACETAMINOPHEN 10 MG/ML IV SOLN
INTRAVENOUS | Status: AC
  Filled 2019-03-28: qty 100

## 2019-03-28 MED ORDER — SUGAMMADEX SODIUM 200 MG/2ML IV SOLN
INTRAVENOUS | Status: DC | PRN
  Administered 2019-03-28: 300 mg via INTRAVENOUS

## 2019-03-28 MED ORDER — SUCCINYLCHOLINE CHLORIDE 20 MG/ML IJ SOLN
INTRAMUSCULAR | Status: DC | PRN
  Administered 2019-03-28: 120 mg via INTRAVENOUS

## 2019-03-28 MED ORDER — MIDAZOLAM HCL 2 MG/2ML IJ SOLN
INTRAMUSCULAR | Status: DC | PRN
  Administered 2019-03-28: 2 mg via INTRAVENOUS

## 2019-03-28 MED ORDER — SODIUM CHLORIDE FLUSH 0.9 % IV SOLN
INTRAVENOUS | Status: AC
  Filled 2019-03-28: qty 10

## 2019-03-28 MED ORDER — FENTANYL CITRATE (PF) 100 MCG/2ML IJ SOLN
INTRAMUSCULAR | Status: AC
  Administered 2019-03-28: 25 ug via INTRAVENOUS
  Filled 2019-03-28: qty 2

## 2019-03-28 MED ORDER — ACETAMINOPHEN 500 MG PO TABS: 1000.0000 mg | ORAL_TABLET | ORAL | Status: DC

## 2019-03-28 MED ORDER — HYDROCODONE-ACETAMINOPHEN 5-325 MG PO TABS: 1.0000 | ORAL_TABLET | Freq: Four times a day (QID) | ORAL | 0 refills | Status: DC | PRN

## 2019-03-28 MED ORDER — LACTATED RINGERS IV SOLN
INTRAVENOUS | Status: DC | PRN
  Administered 2019-03-28: 11:00:00 via INTRAVENOUS

## 2019-03-28 MED ORDER — DEXAMETHASONE SODIUM PHOSPHATE 10 MG/ML IJ SOLN
INTRAMUSCULAR | Status: DC | PRN
  Administered 2019-03-28: 10 mg via INTRAVENOUS

## 2019-03-28 MED ORDER — ACETAMINOPHEN 500 MG PO TABS
ORAL_TABLET | ORAL | Status: AC
  Filled 2019-03-28: qty 2

## 2019-03-28 MED ORDER — CEFAZOLIN SODIUM-DEXTROSE 2-4 GM/100ML-% IV SOLN
INTRAVENOUS | Status: AC
  Filled 2019-03-28: qty 100

## 2019-03-28 MED ORDER — DEXAMETHASONE SODIUM PHOSPHATE 10 MG/ML IJ SOLN
INTRAMUSCULAR | Status: AC
  Filled 2019-03-28: qty 1

## 2019-03-28 MED ORDER — PROPOFOL 10 MG/ML IV BOLUS
INTRAVENOUS | Status: AC
  Filled 2019-03-28: qty 40

## 2019-03-28 MED ORDER — ONDANSETRON HCL 4 MG/2ML IJ SOLN
INTRAMUSCULAR | Status: DC | PRN
  Administered 2019-03-28: 4 mg via INTRAVENOUS

## 2019-03-28 MED ORDER — FAMOTIDINE 20 MG PO TABS
20.0000 mg | ORAL_TABLET | Freq: Once | ORAL | Status: AC
  Administered 2019-03-28: 20 mg via ORAL

## 2019-03-28 MED ORDER — LIDOCAINE HCL (CARDIAC) PF 100 MG/5ML IV SOSY
PREFILLED_SYRINGE | INTRAVENOUS | Status: DC | PRN
  Administered 2019-03-28: 100 mg via INTRAVENOUS

## 2019-03-28 MED ORDER — SUGAMMADEX SODIUM 200 MG/2ML IV SOLN
INTRAVENOUS | Status: AC
  Filled 2019-03-28: qty 4

## 2019-03-28 MED ORDER — PROMETHAZINE HCL 25 MG/ML IJ SOLN
6.2500 mg | INTRAMUSCULAR | Status: DC | PRN
  Administered 2019-03-28: 13:00:00 6.25 mg via INTRAVENOUS

## 2019-03-28 MED ORDER — SUCCINYLCHOLINE CHLORIDE 20 MG/ML IJ SOLN
INTRAMUSCULAR | Status: AC
  Filled 2019-03-28: qty 1

## 2019-03-28 MED ORDER — FENTANYL CITRATE (PF) 100 MCG/2ML IJ SOLN
INTRAMUSCULAR | Status: DC | PRN
  Administered 2019-03-28 (×3): 50 ug via INTRAVENOUS

## 2019-03-28 MED ORDER — FENTANYL CITRATE (PF) 250 MCG/5ML IJ SOLN
INTRAMUSCULAR | Status: AC
  Filled 2019-03-28: qty 5

## 2019-03-28 MED ORDER — PHENYLEPHRINE HCL (PRESSORS) 10 MG/ML IV SOLN
INTRAVENOUS | Status: DC | PRN
  Administered 2019-03-28: 100 ug via INTRAVENOUS
  Administered 2019-03-28 (×2): 150 ug via INTRAVENOUS
  Administered 2019-03-28 (×2): 100 ug via INTRAVENOUS

## 2019-03-28 MED ORDER — ACETAMINOPHEN 10 MG/ML IV SOLN
INTRAVENOUS | Status: DC | PRN
  Administered 2019-03-28: 1000 mg via INTRAVENOUS

## 2019-03-28 MED ORDER — BUPIVACAINE-EPINEPHRINE (PF) 0.25% -1:200000 IJ SOLN
INTRAMUSCULAR | Status: AC
  Filled 2019-03-28: qty 30

## 2019-03-28 SURGICAL SUPPLY — 32 items
APPLIER CLIP 9.375 SM OPEN (CLIP)
BINDER BREAST XXLRG (GAUZE/BANDAGES/DRESSINGS) ×3 IMPLANT
BLADE SURG 15 STRL LF DISP TIS (BLADE) ×1 IMPLANT
BLADE SURG 15 STRL SS (BLADE) ×2
CANISTER SUCT 1200ML W/VALVE (MISCELLANEOUS) ×3 IMPLANT
CHLORAPREP W/TINT 26 (MISCELLANEOUS) ×3 IMPLANT
CLIP APPLIE 9.375 SM OPEN (CLIP) IMPLANT
COVER WAND RF STERILE (DRAPES) ×3 IMPLANT
DERMABOND ADVANCED (GAUZE/BANDAGES/DRESSINGS) ×2
DERMABOND ADVANCED .7 DNX12 (GAUZE/BANDAGES/DRESSINGS) ×1 IMPLANT
DRAPE CHEST BREAST 77X106 FENE (MISCELLANEOUS) ×3 IMPLANT
ELECT CAUTERY BLADE 6.4 (BLADE) ×3 IMPLANT
ELECT REM PT RETURN 9FT ADLT (ELECTROSURGICAL) ×3
ELECTRODE REM PT RTRN 9FT ADLT (ELECTROSURGICAL) ×1 IMPLANT
GLOVE BIO SURGEON STRL SZ7 (GLOVE) ×3 IMPLANT
GLOVE INDICATOR 7.5 STRL GRN (GLOVE) ×3 IMPLANT
GOWN STRL REUS W/ TWL LRG LVL3 (GOWN DISPOSABLE) ×2 IMPLANT
GOWN STRL REUS W/TWL LRG LVL3 (GOWN DISPOSABLE) ×4
KIT TURNOVER KIT A (KITS) ×3 IMPLANT
MARGIN MAP 10MM (MISCELLANEOUS) ×3 IMPLANT
NEEDLE HYPO 22GX1.5 SAFETY (NEEDLE) ×3 IMPLANT
PACK BASIN MINOR ARMC (MISCELLANEOUS) ×3 IMPLANT
SPONGE LAP 18X18 RF (DISPOSABLE) ×3 IMPLANT
SUT MNCRL 4-0 (SUTURE) ×4
SUT MNCRL 4-0 27XMFL (SUTURE) ×2
SUT SILK 2 0 SH (SUTURE) ×3 IMPLANT
SUT VIC AB 2-0 SH 27 (SUTURE) ×8
SUT VIC AB 2-0 SH 27XBRD (SUTURE) ×4 IMPLANT
SUT VIC AB 3-0 SH 27 (SUTURE) ×4
SUT VIC AB 3-0 SH 27X BRD (SUTURE) ×2 IMPLANT
SUTURE MNCRL 4-0 27XMF (SUTURE) ×2 IMPLANT
WATER STERILE IRR 1000ML POUR (IV SOLUTION) IMPLANT

## 2019-03-28 NOTE — Transfer of Care (Signed)
Immediate Anesthesia Transfer of Care Note  Patient: Elizabeth Lawson  Procedure(s) Performed: PARTIAL MASTECTOMY WITH NEEDLE LOCALIZATION (Left )  Patient Location: PACU  Anesthesia Type:General  Level of Consciousness: drowsy  Airway & Oxygen Therapy: Patient Spontanous Breathing and Patient connected to face mask oxygen  Post-op Assessment: Report given to RN and Post -op Vital signs reviewed and stable  Post vital signs: stable  Last Vitals:  Vitals Value Taken Time  BP 125/73 03/28/19 1211  Temp 36.2 C 03/28/19 1211  Pulse 69 03/28/19 1212  Resp 13 03/28/19 1212  SpO2 100 % 03/28/19 1212  Vitals shown include unvalidated device data.  Last Pain:  Vitals:   03/28/19 0901  TempSrc: Oral  PainSc: 0-No pain         Complications: No apparent anesthesia complications

## 2019-03-28 NOTE — Interval H&P Note (Signed)
History and Physical Interval Note:  03/28/2019 10:12 AM  Elizabeth Lawson  has presented today for surgery, with the diagnosis of intraductal papilloma left breast, D36.9. I d/w our breast radiologist and they were able to place a wire for proper localization.  The various methods of treatment have been discussed with the patient and family. After consideration of risks, benefits and other options for treatment, the patient has consented to  Procedure(s): PARTIAL MASTECTOMY WITH NEEDLE LOCALIZATION (Left) as a surgical intervention.  The patient's history has been reviewed, patient examined, no change in status, stable for surgery.  I have reviewed the patient's chart and labs.  Questions were answered to the patient's satisfaction.     [place]

## 2019-03-28 NOTE — Anesthesia Procedure Notes (Signed)
Procedure Name: Intubation Date/Time: 03/28/2019 10:54 AM Performed by: Lavone Orn, CRNA Pre-anesthesia Checklist: Patient identified, Emergency Drugs available, Suction available, Patient being monitored and Timeout performed Patient Re-evaluated:Patient Re-evaluated prior to induction Oxygen Delivery Method: Circle system utilized Preoxygenation: Pre-oxygenation with 100% oxygen Induction Type: IV induction and Cricoid Pressure applied Ventilation: Mask ventilation without difficulty Laryngoscope Size: Mac and 4 Grade View: Grade II Tube type: Oral Tube size: 7.0 mm Number of attempts: 1 Airway Equipment and Method: Stylet Placement Confirmation: ETT inserted through vocal cords under direct vision,  positive ETCO2 and breath sounds checked- equal and bilateral Secured at: 22 cm Tube secured with: Tape Dental Injury: Teeth and Oropharynx as per pre-operative assessment

## 2019-03-28 NOTE — Anesthesia Preprocedure Evaluation (Signed)
Anesthesia Evaluation  Patient identified by MRN, date of birth, ID band Patient awake    Reviewed: Allergy & Precautions, NPO status , Patient's Chart, lab work & pertinent test results  History of Anesthesia Complications Negative for: history of anesthetic complications  Airway Mallampati: II  TM Distance: >3 FB Neck ROM: Full    Dental no notable dental hx.    Pulmonary neg pulmonary ROS,    Pulmonary exam normal breath sounds clear to auscultation       Cardiovascular Exercise Tolerance: Good negative cardio ROS Normal cardiovascular exam Rhythm:Regular Rate:Normal     Neuro/Psych negative neurological ROS  negative psych ROS   GI/Hepatic Neg liver ROS, GERD  Medicated,  Endo/Other  negative endocrine ROS  Renal/GU negative Renal ROS  negative genitourinary   Musculoskeletal negative musculoskeletal ROS (+)   Abdominal   Peds negative pediatric ROS (+)  Hematology negative hematology ROS (+)   Anesthesia Other Findings   Reproductive/Obstetrics negative OB ROS                             Anesthesia Physical  Anesthesia Plan  ASA: II  Anesthesia Plan: General   Post-op Pain Management:    Induction: Intravenous, Cricoid pressure planned and Rapid sequence  PONV Risk Score and Plan:   Airway Management Planned: Oral ETT  Additional Equipment:   Intra-op Plan:   Post-operative Plan: Extubation in OR  Informed Consent:     Dental advisory given  Plan Discussed with: CRNA and Surgeon  Anesthesia Plan Comments:         Anesthesia Quick Evaluation

## 2019-03-28 NOTE — Anesthesia Post-op Follow-up Note (Signed)
Anesthesia QCDR form completed.        

## 2019-03-28 NOTE — Anesthesia Postprocedure Evaluation (Signed)
Anesthesia Post Note  Patient: Elizabeth Lawson  Procedure(s) Performed: PARTIAL MASTECTOMY WITH NEEDLE LOCALIZATION (Left )  Patient location during evaluation: PACU Anesthesia Type: General Level of consciousness: awake and alert and oriented Pain management: pain level controlled Vital Signs Assessment: post-procedure vital signs reviewed and stable Respiratory status: spontaneous breathing Cardiovascular status: blood pressure returned to baseline Anesthetic complications: no     Last Vitals:  Vitals:   03/28/19 1325 03/28/19 1358  BP: 106/74 113/87  Pulse: 62 (!) 59  Resp: 15 18  Temp: (!) 36.1 C 36.4 C  SpO2: 94% 98%    Last Pain:  Vitals:   03/28/19 1358  TempSrc:   PainSc: 5                  [name]

## 2019-03-28 NOTE — Op Note (Addendum)
  Pre-operative Diagnosis: Intraductal papilloma Left breast  Post-operative Diagnosis: Same   Surgeon: Caroleen Hamman, MD FACS  Anesthesia: General  Procedure: Left Partial mastectomy with margins, needle directed. ( central duct excision Left breast)  Findings:Wire and clip within resected specimen by Xray  Estimated Blood Loss: Minimal         Drains: None         Specimens: partial mastectomy with labels, long lateral and short superior; sentinel node for frozen section       Complications: none               Condition: Stable   Procedure Details  The patient was seen again in the Holding Room. The benefits, complications, treatment options, and expected outcomes were discussed with the patient. The risks of bleeding, infection, recurrence of symptoms, failure to resolve symptoms, hematoma, seroma, open wound, cosmetic deformity, and the need for further surgery were discussed.  The patient was taken to Operating Room, identified as Elizabeth Lawson and the procedure verified.  A Time Out was held and the above information confirmed.  Prior to the induction of general anesthesia, antibiotic prophylaxis was administered. VTE prophylaxis was in place. Appropriate anesthesia was then administered and tolerated well. The chest was prepped with Chloraprep and draped in the sterile fashion. The patient was positioned in the supine position.   Attention was turned to the needle localization site where an incision was made encompassing the needle insertion site inferior periareolar fashion. Dissection around the needle to perform a partial mastectomy with adequate margins was performed. Please note that we created inferior and superior flaps to be able to perform a good central duct excision , incorporating all retroreolar tissue.  This was done with electrocautery and sharp dissection.  Hemostasis was with electrocautery.  Once assuring that hemostasis was adequate and checked multiple  times the wound was closed with interrupted 3-0 Vicryl followed by 4-0 subcuticular Monocryl sutures. Dermabond was placed. Patient was taken to the recovery room in stable condition     Caroleen Hamman, MD, FACS

## 2019-03-29 ENCOUNTER — Encounter: Payer: Self-pay | Admitting: Surgery

## 2019-04-01 LAB — SURGICAL PATHOLOGY

## 2019-04-02 ENCOUNTER — Telehealth: Payer: Self-pay | Admitting: *Deleted

## 2019-04-02 NOTE — Telephone Encounter (Signed)
Notified patient as instructed, patient pleased. Discussed follow-up appointments, patient agrees. No cancer

## 2019-04-07 ENCOUNTER — Ambulatory Visit (INDEPENDENT_AMBULATORY_CARE_PROVIDER_SITE_OTHER): Payer: BLUE CROSS/BLUE SHIELD | Admitting: Surgery

## 2019-04-07 ENCOUNTER — Encounter: Payer: Self-pay | Admitting: Surgery

## 2019-04-07 ENCOUNTER — Other Ambulatory Visit: Payer: Self-pay

## 2019-04-07 VITALS — BP 140/80 | HR 74 | Temp 97.8°F | Ht 70.0 in | Wt 264.0 lb

## 2019-04-07 DIAGNOSIS — Z09 Encounter for follow-up examination after completed treatment for conditions other than malignant neoplasm: Secondary | ICD-10-CM

## 2019-04-07 NOTE — Patient Instructions (Signed)
The patient has been asked to return to the office in one year with a bilateral diagnostic mammogram.The patient is aware to call back for any questions or concerns. 

## 2019-04-08 ENCOUNTER — Encounter: Payer: Self-pay | Admitting: Surgery

## 2019-04-08 NOTE — Progress Notes (Signed)
S/p central duct excision doing well Path d/w pt no concerning lesions Some scant drainage from wounds area No fevers or chills minimal discomfort  PE NAD Wound healing well, I saw some scant in the dressing but was unable to express any drainage from wound. The nipple is viable, there is a tiny central portion of the nipple with darker color. No infection, no collection and no deformity  A/P Doing well May do dry dressing prn RTC 1 year w imaging

## 2019-04-09 ENCOUNTER — Encounter: Payer: BLUE CROSS/BLUE SHIELD | Admitting: Surgery

## 2019-09-19 ENCOUNTER — Encounter: Payer: Self-pay | Admitting: Certified Nurse Midwife

## 2019-09-19 ENCOUNTER — Other Ambulatory Visit: Payer: Self-pay

## 2019-09-19 ENCOUNTER — Ambulatory Visit (INDEPENDENT_AMBULATORY_CARE_PROVIDER_SITE_OTHER): Payer: BLUE CROSS/BLUE SHIELD | Admitting: Certified Nurse Midwife

## 2019-09-19 VITALS — BP 119/72 | HR 68 | Ht 70.0 in | Wt 250.3 lb

## 2019-09-19 DIAGNOSIS — Z6835 Body mass index (BMI) 35.0-35.9, adult: Secondary | ICD-10-CM

## 2019-09-19 DIAGNOSIS — B372 Candidiasis of skin and nail: Secondary | ICD-10-CM | POA: Diagnosis not present

## 2019-09-19 DIAGNOSIS — Z01419 Encounter for gynecological examination (general) (routine) without abnormal findings: Secondary | ICD-10-CM | POA: Diagnosis not present

## 2019-09-19 DIAGNOSIS — Z1231 Encounter for screening mammogram for malignant neoplasm of breast: Secondary | ICD-10-CM

## 2019-09-19 MED ORDER — NYSTATIN 100000 UNIT/GM EX CREA: 1.0000 "application " | TOPICAL_CREAM | Freq: Two times a day (BID) | CUTANEOUS | 1 refills | Status: DC

## 2019-09-19 NOTE — Progress Notes (Signed)
Patient here for annual exam. No complaints.

## 2019-09-19 NOTE — Patient Instructions (Signed)
Nystatin skin cream or ointment What is this medicine? NYSTATIN (nye STAT in) is an antifungal medicine. It is used to treat certain kinds of fungal or yeast infections of the skin. This medicine may be used for other purposes; ask your health care provider or pharmacist if you have questions. COMMON BRAND NAME(S): Mycostatin, Nystex, Pediaderm AF What should I tell my health care provider before I take this medicine? They need to know if you have any of these conditions:  an unusual or allergic reaction to nystatin, other foods, dyes or preservatives  pregnant or trying to get pregnant  breast-feeding How should I use this medicine? This medicine is for external use on the skin only. Follow the directions on the prescription label. Wash hands before and after use. If treating a hand or nail infection, wash hands before use only. Apply a thin layer of this medicine to cover the affected skin and surrounding area. You can cover the area with a sterile gauze dressing (bandage). Do not use an airtight bandage (such as a plastic-covered bandage). Do not get the medicine in your eyes. If you do, rinse out with plenty of cool tap water. Use the full course of treatment prescribed, even if you think the infection is getting better. Use at regular intervals. Do not use your medicine more often than directed. Do not use this medicine for any condition other than the one for which it was prescribed. Talk to your pediatrician regarding the use of this medicine in children. Special care may be needed. Overdosage: If you think you have taken too much of this medicine contact a poison control center or emergency room at once. NOTE: This medicine is only for you. Do not share this medicine with others. What if I miss a dose? If you miss a dose, use it as soon as you can. If it is almost time for your next dose, use only that dose. Do not use double or extra doses. What may interact with this  medicine? Interactions are not expected. Do not use any other skin products on the affected area without telling your doctor or health care professional. This list may not describe all possible interactions. Give your health care provider a list of all the medicines, herbs, non-prescription drugs, or dietary supplements you use. Also tell them if you smoke, drink alcohol, or use illegal drugs. Some items may interact with your medicine. What should I watch for while using this medicine? Tell your doctor or health care professional if your symptoms do not improve after 3 days. After bathing make sure that your skin is very dry. Fungal infections like moist conditions. Do not walk around barefoot. To help prevent reinfection, wear freshly washed cotton, not synthetic, clothing. What side effects may I notice from receiving this medicine? Side effects that usually do not require medical attention (report to your doctor or health care professional if they continue or are bothersome):  skin irritation This list may not describe all possible side effects. Call your doctor for medical advice about side effects. You may report side effects to FDA at 1-800-FDA-1088. Where should I keep my medicine? Keep out of the reach of children. Store at room temperature 15 to 30 degrees C (59 to 86 degrees F). Throw away any unused medicine after the expiration date. NOTE: This sheet is a summary. It may not cover all possible information. If you have questions about this medicine, talk to your doctor, pharmacist, or health care provider.  2020  Elsevier/Gold Standard (2015-08-04 16:28:30) Skin Yeast Infection  A skin yeast infection is a condition in which there is an overgrowth of yeast (candida) that normally lives on the skin. This condition usually occurs in areas of the skin that are constantly warm and moist, such as the armpits or the groin. What are the causes? This condition is caused by a change in the  normal balance of the yeast and bacteria that live on the skin. What increases the risk? You are more likely to develop this condition if you:  Are obese.  Are pregnant.  Take birth control pills.  Have diabetes.  Take antibiotic medicines.  Take steroid medicines.  Are malnourished.  Have a weak body defense system (immune system).  Are 69 years of age or older.  Wear tight clothing. What are the signs or symptoms? The most common symptom of this condition is itchiness in the affected area. Other symptoms include:  Red, swollen area of the skin.  Bumps on the skin. How is this diagnosed?  This condition is diagnosed with a medical history and physical exam.  Your health care provider may check for yeast by taking light scrapings of the skin to be viewed under a microscope. How is this treated? This condition is treated with medicine. Medicines may be prescribed or be available over the counter. The medicines may be:  Taken by mouth (orally).  Applied as a cream or powder to your skin. Follow these instructions at home:   Take or apply over-the-counter and prescription medicines only as told by your health care provider.  Maintain a healthy weight. If you need help losing weight, talk with your health care provider.  Keep your skin clean and dry.  If you have diabetes, keep your blood sugar under control.  Keep all follow-up visits as told by your health care provider. This is important. Contact a health care provider if:  Your symptoms go away and then return.  Your symptoms do not get better with treatment.  Your symptoms get worse.  Your rash spreads.  You have a fever or chills.  You have new symptoms.  You have new warmth or redness of your skin. Summary  A skin yeast infection is a condition in which there is an overgrowth of yeast (candida) that normally lives on the skin. This condition is caused by a change in the normal balance of the  yeast and bacteria that live on the skin.  Take or apply over-the-counter and prescription medicines only as told by your health care provider.  Keep your skin clean and dry.  Contact a health care provider if your symptoms do not get better with treatment. This information is not intended to replace advice given to you by your health care provider. Make sure you discuss any questions you have with your health care provider. Document Revised: 11/20/2017 Document Reviewed: 11/20/2017 Elsevier Patient Education  2020 Elsevier Inc. Preventive Care 33-64 Years Old, Female Preventive care refers to visits with your health care provider and lifestyle choices that can promote health and wellness. This includes:  A yearly physical exam. This may also be called an annual well check.  Regular dental visits and eye exams.  Immunizations.  Screening for certain conditions.  Healthy lifestyle choices, such as eating a healthy diet, getting regular exercise, not using drugs or products that contain nicotine and tobacco, and limiting alcohol use. What can I expect for my preventive care visit? Physical exam Your health care provider will check  your:  Height and weight. This may be used to calculate body mass index (BMI), which tells if you are at a healthy weight.  Heart rate and blood pressure.  Skin for abnormal spots. Counseling Your health care provider may ask you questions about your:  Alcohol, tobacco, and drug use.  Emotional well-being.  Home and relationship well-being.  Sexual activity.  Eating habits.  Work and work Statistician.  Method of birth control.  Menstrual cycle.  Pregnancy history. What immunizations do I need?  Influenza (flu) vaccine  This is recommended every year. Tetanus, diphtheria, and pertussis (Tdap) vaccine  You may need a Td booster every 10 years. Varicella (chickenpox) vaccine  You may need this if you have not been vaccinated. Zoster  (shingles) vaccine  You may need this after age 53. Measles, mumps, and rubella (MMR) vaccine  You may need at least one dose of MMR if you were born in 1957 or later. You may also need a second dose. Pneumococcal conjugate (PCV13) vaccine  You may need this if you have certain conditions and were not previously vaccinated. Pneumococcal polysaccharide (PPSV23) vaccine  You may need one or two doses if you smoke cigarettes or if you have certain conditions. Meningococcal conjugate (MenACWY) vaccine  You may need this if you have certain conditions. Hepatitis A vaccine  You may need this if you have certain conditions or if you travel or work in places where you may be exposed to hepatitis A. Hepatitis B vaccine  You may need this if you have certain conditions or if you travel or work in places where you may be exposed to hepatitis B. Haemophilus influenzae type b (Hib) vaccine  You may need this if you have certain conditions. Human papillomavirus (HPV) vaccine  If recommended by your health care provider, you may need three doses over 6 months. You may receive vaccines as individual doses or as more than one vaccine together in one shot (combination vaccines). Talk with your health care provider about the risks and benefits of combination vaccines. What tests do I need? Blood tests  Lipid and cholesterol levels. These may be checked every 5 years, or more frequently if you are over 13 years old.  Hepatitis C test.  Hepatitis B test. Screening  Lung cancer screening. You may have this screening every year starting at age 11 if you have a 30-pack-year history of smoking and currently smoke or have quit within the past 15 years.  Colorectal cancer screening. All adults should have this screening starting at age 50 and continuing until age 74. Your health care provider may recommend screening at age 36 if you are at increased risk. You will have tests every 1-10 years, depending  on your results and the type of screening test.  Diabetes screening. This is done by checking your blood sugar (glucose) after you have not eaten for a while (fasting). You may have this done every 1-3 years.  Mammogram. This may be done every 1-2 years. Talk with your health care provider about when you should start having regular mammograms. This may depend on whether you have a family history of breast cancer.  BRCA-related cancer screening. This may be done if you have a family history of breast, ovarian, tubal, or peritoneal cancers.  Pelvic exam and Pap test. This may be done every 3 years starting at age 72. Starting at age 25, this may be done every 5 years if you have a Pap test in combination with  an HPV test. Other tests  Sexually transmitted disease (STD) testing.  Bone density scan. This is done to screen for osteoporosis. You may have this scan if you are at high risk for osteoporosis. Follow these instructions at home: Eating and drinking  Eat a diet that includes fresh fruits and vegetables, whole grains, lean protein, and low-fat dairy.  Take vitamin and mineral supplements as recommended by your health care provider.  Do not drink alcohol if: ? Your health care provider tells you not to drink. ? You are pregnant, may be pregnant, or are planning to become pregnant.  If you drink alcohol: ? Limit how much you have to 0-1 drink a day. ? Be aware of how much alcohol is in your drink. In the [place] one drink equals one 12 oz bottle of beer (355 mL), one 5 oz glass of wine (148 mL), or one 1 oz glass of hard liquor (44 mL). Lifestyle  Take daily care of your teeth and gums.  Stay active. Exercise for at least 30 minutes on 5 or more days each week.  Do not use any products that contain nicotine or tobacco, such as cigarettes, e-cigarettes, and chewing tobacco. If you need help quitting, ask your health care provider.  If you are sexually active, practice safe sex. Use  a condom or other form of birth control (contraception) in order to prevent pregnancy and STIs (sexually transmitted infections).  If told by your health care provider, take low-dose aspirin daily starting at age 23. What's next?  Visit your health care provider once a year for a well check visit.  Ask your health care provider how often you should have your eyes and teeth checked.  Stay up to date on all vaccines. This information is not intended to replace advice given to you by your health care provider. Make sure you discuss any questions you have with your health care provider. Document Revised: 03/14/2018 Document Reviewed: 03/14/2018 Elsevier Patient Education  2020 [employer].

## 2019-09-20 LAB — THYROID PANEL WITH TSH
Free Thyroxine Index: 1.6 (ref 1.2–4.9)
T3 Uptake Ratio: 25 % (ref 24–39)
T4, Total: 6.3 ug/dL (ref 4.5–12.0)
TSH: 1.71 u[IU]/mL (ref 0.450–4.500)

## 2019-09-20 LAB — CBC
Hematocrit: 43 % (ref 34.0–46.6)
Hemoglobin: 13.5 g/dL (ref 11.1–15.9)
MCH: 25.8 pg — ABNORMAL LOW (ref 26.6–33.0)
MCHC: 31.4 g/dL — ABNORMAL LOW (ref 31.5–35.7)
MCV: 82 fL (ref 79–97)
Platelets: 311 10*3/uL (ref 150–450)
RBC: 5.23 x10E6/uL (ref 3.77–5.28)
RDW: 14.1 % (ref 11.7–15.4)
WBC: 7 10*3/uL (ref 3.4–10.8)

## 2019-09-20 LAB — HEMOGLOBIN A1C
Est. average glucose Bld gHb Est-mCnc: 103 mg/dL
Hgb A1c MFr Bld: 5.2 % (ref 4.8–5.6)

## 2019-09-20 LAB — VITAMIN D 25 HYDROXY (VIT D DEFICIENCY, FRACTURES): Vit D, 25-Hydroxy: 13 ng/mL — ABNORMAL LOW (ref 30.0–100.0)

## 2019-09-21 NOTE — Progress Notes (Signed)
ANNUAL PREVENTATIVE CARE GYN  ENCOUNTER NOTE  Subjective:       Elizabeth Lawson is a 39 y.o. G20P1001 female here for a routine annual gynecologic exam.  Current complaints: 1. Yeast rash under bilateral breasts 2. Needs order for mammogram  Denies difficulty breathing or respiratory distress, chest pain, abdominal pain, excessive vaginal bleeding, dysuria, and leg pain or swelling.    Gynecologic History  Patient's last menstrual period was 09/05/2019 (exact date).  Contraception: tubal ligation  Last Pap: 09/2017. Results were: Neg/Neg  Last mammogram: 01/2019. Results were: BI-RADS 3  Obstetric History  OB History  Gravida Para Term Preterm AB Living  1 1 1     1   SAB TAB Ectopic Multiple Live Births          1    # Outcome Date GA Lbr Len/2nd Weight Sex Delivery Anes PTL Lv  1 Term 02/04/07   7 lb 9 oz (3.43 kg) M Vag-Spont EPI N LIV    Past Medical History:  Diagnosis Date  . Breast mass   . Dysphagia   . Environmental and seasonal allergies   . GERD (gastroesophageal reflux disease)   . Vaginal Pap smear, abnormal    ASCUS HR HPV negative (06/14/15)    Past Surgical History:  Procedure Laterality Date  . BREAST BIOPSY Left 02/24/2019   u/s bx, papilloma  . BREAST EXCISIONAL BIOPSY Left 03/28/2019  . coloposcopy  06/14/2015   bx cervix at 6:00 RESULTS: CIN I (Mild Squamous Dysplasia), LGSIL.  ECC: koilocytic atypia consistent with hpv  . ESOPHAGOGASTRODUODENOSCOPY N/A 12/19/2014   Procedure: ESOPHAGOGASTRODUODENOSCOPY (EGD);  Surgeon: Lollie Sails, MD;  Location: [facility] ENDOSCOPY;  Service: Endoscopy;  Laterality: N/A;  . FOREIGN BODY REMOVAL N/A 12/19/2014   Procedure: FOREIGN BODY REMOVAL;  Surgeon: Lollie Sails, MD;  Location[facility] ENDOSCOPY;  Service: Endoscopy;  Laterality: N/A;  . PARTIAL MASTECTOMY WITH NEEDLE LOCALIZATION Left 03/28/2019   Procedure: PARTIAL MASTECTOMY WITH NEEDLE LOCALIZATION;  Surgeon: Jules Husbands, MD;  Location: [facility] ORS;   Service: General;  Laterality: Left;  . TUBAL LIGATION Bilateral     Current Outpatient Medications on File Prior to Visit  Medication Sig Dispense Refill  . diphenhydrAMINE (BENADRYL) 25 MG tablet Take 100 mg by mouth daily.    . fexofenadine (ALLEGRA) 180 MG tablet Take 180 mg by mouth daily.     No current facility-administered medications on file prior to visit.    No Known Allergies  Social History   Socioeconomic History  . Marital status: Married    Spouse name: Not on file  . Number of children: Not on file  . Years of education: Not on file  . Highest education level: Not on file  Occupational History  . Not on file  Tobacco Use  . Smoking status: Never Smoker  . Smokeless tobacco: Never Used  Substance and Sexual Activity  . Alcohol use: No  . Drug use: No  . Sexual activity: Yes    Birth control/protection: Surgical    Comment: Tubal ligation  Other Topics Concern  . Not on file  Social History Narrative  . Not on file   Social Determinants of Health   Financial Resource Strain:   . Difficulty of Paying Living Expenses: Not on file  Food Insecurity:   . Worried About Charity fundraiser in the Last Year: Not on file  . Ran Out of Food in the Last Year: Not on file  Transportation Needs:   .  Lack of Transportation (Medical): Not on file  . Lack of Transportation (Non-Medical): Not on file  Physical Activity:   . Days of Exercise per Week: Not on file  . Minutes of Exercise per Session: Not on file  Stress:   . Feeling of Stress : Not on file  Social Connections:   . Frequency of Communication with Friends and Family: Not on file  . Frequency of Social Gatherings with Friends and Family: Not on file  . Attends Religious Services: Not on file  . Active Member of Clubs or Organizations: Not on file  . Attends Archivist Meetings: Not on file  . Marital Status: Not on file  Intimate Partner Violence:   . Fear of Current or Ex-Partner: Not  on file  . Emotionally Abused: Not on file  . Physically Abused: Not on file  . Sexually Abused: Not on file    Family History  Problem Relation Age of Onset  . Hypertension Father   . Melanoma Father   . Depression Father   . Ulcerative colitis Father   . Ovarian cancer Mother        small cell, stage 1  . Hypertension Paternal Grandfather   . Heart disease Paternal Grandfather   . Breast cancer Other   . Ovarian cancer Other   . Lung cancer Other   . Colon cancer Neg Hx     The following portions of the patient's history were reviewed and updated as appropriate: allergies, current medications, past family history, past medical history, past social history, past surgical history and problem list.  Review of Systems  ROS negative except as noted above. Information obtained from patient.    Objective:   BP 119/72   Pulse 68   Ht 5\' 10"  (1.778 m)   Wt 250 lb 4.8 oz (113.5 kg)   LMP 09/05/2019 (Exact Date)   BMI 35.91 kg/m    CONSTITUTIONAL: Well-developed, well-nourished female in no acute distress.   PSYCHIATRIC: Normal mood and affect. Normal behavior. Normal judgment and thought content.  [place]: Alert and oriented to person, place, and time. Normal muscle tone coordination. No cranial  nerve deficit noted.  HENT:  Normocephalic, atraumatic, External right and left ear normal.   EYES: Conjunctivae and EOM are normal. Pupils are equal and round.   NECK: Normal range of motion, supple, no masses.  Normal thyroid.   SKIN: Skin is warm and dry. No rash noted. Not diaphoretic. No erythema. No pallor.  CARDIOVASCULAR: Normal heart rate noted, regular rhythm, no murmur.  RESPIRATORY: Clear to auscultation bilaterally. Effort and breath sounds normal, no problems with respiration noted.  BREASTS: Symmetric in size. No masses, skin changes, nipple drainage, or lymphadenopathy.  ABDOMEN: Soft, normal bowel sounds, no distention noted.  No tenderness, rebound or  guarding.   PELVIC:  External Genitalia: Normal  Vagina: Normal  Cervix: Normal  Uterus: Normal  Adnexa: Normal  MUSCULOSKELETAL: Normal range of motion. No tenderness.  No cyanosis, clubbing, or edema.  2+ distal pulses.  LYMPHATIC: No Axillary, Supraclavicular, or Inguinal Adenopathy.  Assessment:   Annual gynecologic examination 39 y.o.   Contraception: tubal ligation   Obesity 2   Problem List Items Addressed This Visit    None    Visit Diagnoses    Well woman exam    -  Primary   Relevant Orders   CBC (Completed)   Thyroid Panel With TSH (Completed)   Hemoglobin A1c (Completed)   VITAMIN D 25 Hydroxy (  Vit-D Deficiency, Fractures) (Completed)   BMI 35.0-35.9,adult       Relevant Orders   CBC (Completed)   Thyroid Panel With TSH (Completed)   Hemoglobin A1c (Completed)   VITAMIN D 25 Hydroxy (Vit-D Deficiency, Fractures) (Completed)   Skin yeast infection       Relevant Medications   nystatin cream (MYCOSTATIN)   Other Relevant Orders   Hemoglobin A1c (Completed)   VITAMIN D 25 Hydroxy (Vit-D Deficiency, Fractures) (Completed)      Plan:   Pap: Not needed  Mammogram: Ordered  Labs: See orders  Routine preventative health maintenance measures emphasized: Exercise/Diet/Weight control, Tobacco Warnings, Alcohol/Substance use risks and Stress Management; see AVS  Rx Nystatin cream, see orders  Reviewed red flag symptoms and when to call  Return to [place], CNM [facility], [facility]

## 2019-09-23 ENCOUNTER — Other Ambulatory Visit: Payer: Self-pay

## 2019-09-23 MED ORDER — VITAMIN D (ERGOCALCIFEROL) 1.25 MG (50000 UNIT) PO CAPS: 50000.0000 [IU] | ORAL_CAPSULE | ORAL | 4 refills | Status: DC

## 2020-02-11 ENCOUNTER — Other Ambulatory Visit: Payer: Self-pay

## 2020-02-11 DIAGNOSIS — Z1231 Encounter for screening mammogram for malignant neoplasm of breast: Secondary | ICD-10-CM

## 2020-03-18 ENCOUNTER — Ambulatory Visit
Admission: RE | Admit: 2020-03-18 | Discharge: 2020-03-18 | Disposition: A | Discharge status: Home / Self Care | Payer: 59 | Source: Ambulatory Visit | Attending: Surgery | Admitting: Surgery

## 2020-03-18 DIAGNOSIS — Z1231 Encounter for screening mammogram for malignant neoplasm of breast: Secondary | ICD-10-CM | POA: Diagnosis not present

## 2020-03-23 ENCOUNTER — Telehealth: Payer: Self-pay

## 2020-03-23 NOTE — Telephone Encounter (Signed)
-----   Message from Jules Husbands, MD sent at 03/23/2020 10:41 AM EDT ----- Please let her know mammo was nml ----- Message ----- From: Interface, Rad Results In Sent: 03/19/2020  12:28 PM EDT To: Jules Husbands, MD

## 2020-03-23 NOTE — Telephone Encounter (Signed)
Made patient aware of recent normal mammogram results and made aware of scheduled appointment with Dr.Pabon. Patient asked if she would need to keep the appointment if the mammogram was normal. Per Dr.Pabon states that "during the appointment patient would have breast exam, review mammogram and make sure everything was ok, but up to patient regarding keeping the appointment." Patient states she was feeling fine and has no problems or concerns, so she would like to cancel the appointment. Dr.Pabon was made aware of patient's decision and appointment was cancelled. Advised patient to give our office a call if she notices any problems or concerns.

## 2020-03-29 ENCOUNTER — Ambulatory Visit: Payer: BLUE CROSS/BLUE SHIELD | Admitting: Surgery

## 2020-04-29 ENCOUNTER — Encounter: Payer: Self-pay | Admitting: *Deleted

## 2020-09-23 ENCOUNTER — Other Ambulatory Visit: Payer: Self-pay

## 2020-09-23 ENCOUNTER — Encounter: Payer: BLUE CROSS/BLUE SHIELD | Admitting: Certified Nurse Midwife

## 2020-09-23 ENCOUNTER — Ambulatory Visit (INDEPENDENT_AMBULATORY_CARE_PROVIDER_SITE_OTHER): Payer: 59 | Admitting: Certified Nurse Midwife

## 2020-09-23 ENCOUNTER — Encounter: Payer: Self-pay | Admitting: Certified Nurse Midwife

## 2020-09-23 ENCOUNTER — Other Ambulatory Visit (HOSPITAL_COMMUNITY)
Admission: RE | Admit: 2020-09-23 | Discharge: 2020-09-23 | Disposition: A | Discharge status: Home / Self Care | Payer: 59 | Source: Ambulatory Visit | Attending: Certified Nurse Midwife | Admitting: Certified Nurse Midwife

## 2020-09-23 VITALS — BP 124/82 | HR 88 | Ht 70.0 in | Wt 244.6 lb

## 2020-09-23 DIAGNOSIS — Z1231 Encounter for screening mammogram for malignant neoplasm of breast: Secondary | ICD-10-CM | POA: Diagnosis not present

## 2020-09-23 DIAGNOSIS — Z3009 Encounter for other general counseling and advice on contraception: Secondary | ICD-10-CM

## 2020-09-23 DIAGNOSIS — Z01419 Encounter for gynecological examination (general) (routine) without abnormal findings: Secondary | ICD-10-CM

## 2020-09-23 DIAGNOSIS — Z124 Encounter for screening for malignant neoplasm of cervix: Secondary | ICD-10-CM | POA: Diagnosis not present

## 2020-09-23 DIAGNOSIS — Z30011 Encounter for initial prescription of contraceptive pills: Secondary | ICD-10-CM | POA: Diagnosis not present

## 2020-09-23 DIAGNOSIS — N926 Irregular menstruation, unspecified: Secondary | ICD-10-CM | POA: Diagnosis not present

## 2020-09-23 NOTE — Patient Instructions (Signed)
Preventive Care 22-42 Years Old, Female Preventive care refers to lifestyle choices and visits with your health care provider that can promote health and wellness. This includes:  A yearly physical exam. This is also called an annual wellness visit.  Regular dental and eye exams.  Immunizations.  Screening for certain conditions.  Healthy lifestyle choices, such as: ? Eating a healthy diet. ? Getting regular exercise. ? Not using drugs or products that contain nicotine and tobacco. ? Limiting alcohol use. What can I expect for my preventive care visit? Physical exam Your health care provider will check your:  Height and weight. These may be used to calculate your BMI (body mass index). BMI is a measurement that tells if you are at a healthy weight.  Heart rate and blood pressure.  Body temperature.  Skin for abnormal spots. Counseling Your health care provider may ask you questions about your:  Past medical problems.  Family's medical history.  Alcohol, tobacco, and drug use.  Emotional well-being.  Home life and relationship well-being.  Sexual activity.  Diet, exercise, and sleep habits.  Work and work Statistician.  Access to firearms.  Method of birth control.  Menstrual cycle.  Pregnancy history. What immunizations do I need? Vaccines are usually given at various ages, according to a schedule. Your health care provider will recommend vaccines for you based on your age, medical history, and lifestyle or other factors, such as travel or where you work.   What tests do I need? Blood tests  Lipid and cholesterol levels. These may be checked every 5 years, or more often if you are over 69 years old.  Hepatitis C test.  Hepatitis B test. Screening  Lung cancer screening. You may have this screening every year starting at age 44 if you have a 30-pack-year history of smoking and currently smoke or have quit within the past 15 years.  Colorectal  cancer screening. ? All adults should have this screening starting at age 78 and continuing until age 80. ? Your health care provider may recommend screening at age 86 if you are at increased risk. ? You will have tests every 1-10 years, depending on your results and the type of screening test.  Diabetes screening. ? This is done by checking your blood sugar (glucose) after you have not eaten for a while (fasting). ? You may have this done every 1-3 years.  Mammogram. ? This may be done every 1-2 years. ? Talk with your health care provider about when you should start having regular mammograms. This may depend on whether you have a family history of breast cancer.  BRCA-related cancer screening. This may be done if you have a family history of breast, ovarian, tubal, or peritoneal cancers.  Pelvic exam and Pap test. ? This may be done every 3 years starting at age 21. ? Starting at age 67, this may be done every 5 years if you have a Pap test in combination with an HPV test. Other tests  STD (sexually transmitted disease) testing, if you are at risk.  Bone density scan. This is done to screen for osteoporosis. You may have this scan if you are at high risk for osteoporosis. Talk with your health care provider about your test results, treatment options, and if necessary, the need for more tests. Follow these instructions at home: Eating and drinking  Eat a diet that includes fresh fruits and vegetables, whole grains, lean protein, and low-fat dairy products.  Take vitamin and  as recommended by your health care provider.  Do not drink alcohol if: ? Your health care provider tells you not to drink. ? You are pregnant, may be pregnant, or are planning to become pregnant.  If you drink alcohol: ? Limit how much you have to 0-1 drink a day. ? Be aware of how much alcohol is in your drink. In the [place] one drink equals one 12 oz bottle of beer (355 mL), one 5 oz glass of  wine (148 mL), or one 1 oz glass of hard liquor (44 mL).   Lifestyle  Take daily care of your teeth and gums. Brush your teeth every morning and night with fluoride toothpaste. Floss one time each day.  Stay active. Exercise for at least 30 minutes 5 or more days each week.  Do not use any products that contain nicotine or tobacco, such as cigarettes, e-cigarettes, and chewing tobacco. If you need help quitting, ask your health care provider.  Do not use drugs.  If you are sexually active, practice safe sex. Use a condom or other form of protection to prevent STIs (sexually transmitted infections).  If you do not wish to become pregnant, use a form of birth control. If you plan to become pregnant, see your health care provider for a prepregnancy visit.  If told by your health care provider, take low-dose aspirin daily starting at age 50.  Find healthy ways to cope with stress, such as: ? Meditation, yoga, or listening to music. ? Journaling. ? Talking to a trusted person. ? Spending time with friends and family. Safety  Always wear your seat belt while driving or riding in a vehicle.  Do not drive: ? If you have been drinking alcohol. Do not ride with someone who has been drinking. ? When you are tired or distracted. ? While texting.  Wear a helmet and other protective equipment during sports activities.  If you have firearms in your house, make sure you follow all gun safety procedures. What's next?  Visit your health care provider once a year for an annual wellness visit.  Ask your health care provider how often you should have your eyes and teeth checked.  Stay up to date on all vaccines. This information is not intended to replace advice given to you by your health care provider. Make sure you discuss any questions you have with your health care provider. Document Revised: 04/06/2020 Document Reviewed: 03/14/2018 Elsevier Patient Education  2021 Elsevier Inc.   

## 2020-09-23 NOTE — Progress Notes (Signed)
I have seen, interviewed, and examined the patient in conjunction with the [facility] and affirm the diagnosis and management plan.   Diona Fanti, CNM [facility], [facility] 09/23/20 4:45 PM

## 2020-09-23 NOTE — Progress Notes (Signed)
ANNUAL PREVENTATIVE CARE GYN  ENCOUNTER NOTE  Subjective:       Elizabeth Lawson is a 40 y.o. G22P1001 female here for a routine annual gynecologic exam and pap.  Current complaints: 1.  Irregular periods, some periods heavier then others, and periods do not come at the same time every month; questions use of OCP to regulate cycle  Denies difficulty breathing, respiratory distress, chest pain, abdominal pain, and leg pain or swelling.  Gynecologic History  Patient's last menstrual period was 09/20/2020 (exact date).  Period Duration (Days): 4-8 Period Pattern: (!) Irregular Menstrual Flow: Moderate Menstrual Control: Maxi pad,Tampon Menstrual Control Change Freq (Hours): 4 hours Dysmenorrhea: (!) Moderate Dysmenorrhea Symptoms: Cramping,Headache (breast soreness)  Contraception: tubal ligation  Last Pap: 09/17/2017. Results were: Neg/Neg; history abnormal pap in 2018.   Last mammogram: 03/19/2020. Results were: normal, BI-RADS Cat. 1  Obstetric History  OB History  Gravida Para Term Preterm AB Living  1 1 1     1   SAB IAB Ectopic Multiple Live Births          1    # Outcome Date GA Lbr Len/2nd Weight Sex Delivery Anes PTL Lv  1 Term 02/04/07   3430 g M Vag-Spont EPI N LIV    Past Medical History:  Diagnosis Date  . Dysphagia   . Environmental and seasonal allergies   . GERD (gastroesophageal reflux disease)   . Irregular periods   . Vaginal Pap smear, abnormal    ASCUS HR HPV negative (06/14/15)    Past Surgical History:  Procedure Laterality Date  . BREAST BIOPSY Left 02/24/2019   u/s bx, papilloma  . BREAST LUMPECTOMY Left 03/2019   papilloma  . coloposcopy  06/14/2015   bx cervix at 6:00 RESULTS: CIN I (Mild Squamous Dysplasia), LGSIL.  ECC: koilocytic atypia consistent with hpv  . ESOPHAGOGASTRODUODENOSCOPY N/A 12/19/2014   Procedure: ESOPHAGOGASTRODUODENOSCOPY (EGD);  Surgeon: Lollie Sails, MD;  Location[facility] ENDOSCOPY;  Service: Endoscopy;  Laterality:  N/A;  . FOREIGN BODY REMOVAL N/A 12/19/2014   Procedure: FOREIGN BODY REMOVAL;  Surgeon: Lollie Sails, MD;  Location: [facility] ENDOSCOPY;  Service: Endoscopy;  Laterality: N/A;  . PARTIAL MASTECTOMY WITH NEEDLE LOCALIZATION Left 03/28/2019   Procedure: PARTIAL MASTECTOMY WITH NEEDLE LOCALIZATION;  Surgeon: Jules Husbands, MD;  Location: [facility] ORS;  Service: General;  Laterality: Left;  . TUBAL LIGATION Bilateral     Current Outpatient Medications on File Prior to Visit  Medication Sig Dispense Refill  . diphenhydrAMINE (BENADRYL) 25 MG tablet Take 100 mg by mouth daily.    . fexofenadine (ALLEGRA) 180 MG tablet Take 180 mg by mouth daily.     No current facility-administered medications on file prior to visit.    No Known Allergies  Social History   Socioeconomic History  . Marital status: Married    Spouse name: Not on file  . Number of children: Not on file  . Years of education: Not on file  . Highest education level: Not on file  Occupational History  . Not on file  Tobacco Use  . Smoking status: Never Smoker  . Smokeless tobacco: Never Used  Vaping Use  . Vaping Use: Never used  Substance and Sexual Activity  . Alcohol use: No  . Drug use: No  . Sexual activity: Yes    Birth control/protection: Surgical    Comment: Tubal ligation  Other Topics Concern  . Not on file  Social History Narrative  . Not on file  Social Determinants of Health   Financial Resource Strain: Not on file  Food Insecurity: Not on file  Transportation Needs: Not on file  Physical Activity: Not on file  Stress: Not on file  Social Connections: Not on file  Intimate Partner Violence: Not on file    Family History  Problem Relation Age of Onset  . Hypertension Father   . Melanoma Father   . Depression Father   . Ulcerative colitis Father   . Ovarian cancer Mother        small cell, stage 1  . Hypertension Paternal Grandfather   . Heart disease Paternal Grandfather   . Breast cancer  Other   . Ovarian cancer Other   . Lung cancer Other   . Colon cancer Neg Hx     The following portions of the patient's history were reviewed and updated as appropriate: allergies, current medications, past family history, past medical history, past social history, past surgical history and problem list.  Review of Systems  ROS- Negative other than what was reported above. Information obtained verbally from patient.    Objective:   BP 124/82   Pulse 88   Ht 5\' 10"  (1.778 m)   Wt 110.9 kg   LMP 09/20/2020 (Exact Date)   BMI 35.10 kg/m    CONSTITUTIONAL: Well-developed, well-nourished female in no acute distress.   PSYCHIATRIC: Normal mood and affect. Normal behavior. Normal judgment and thought content.  [place]: Alert and oriented to person, place, and time. Normal muscle tone coordination. No cranial nerve deficit noted.   EYES: Conjunctivae and EOM are normal.   NECK: Normal range of motion, supple, no masses.  Normal thyroid.   SKIN: Skin is warm and dry. No rash noted. Not diaphoretic. No erythema. No pallor.  CARDIOVASCULAR: Normal heart rate noted, regular rhythm, no murmur.  RESPIRATORY: Clear to auscultation bilaterally. Effort and breath sounds normal, no problems with respiration noted.  BREASTS: Symmetric in size. No masses, skin changes, nipple drainage, or lymphadenopathy. Surgical scar on left breast noted.  ABDOMEN: Soft, normal bowel sounds, no distention noted.  No tenderness, rebound or guarding.   PELVIC:  External Genitalia: Normal  BUS: Normal  Vagina: Normal  Cervix: Normal, pap collected  Uterus: Normal  Adnexa: Normal   MUSCULOSKELETAL: Normal range of motion. No tenderness.  No cyanosis, clubbing, or edema.   Assessment:   Annual gynecologic examination 40 y.o.   Contraception: OCP (estrogen/progesterone) and tubal ligation   Obesity 2   Problem List Items Addressed This Visit   None   Visit Diagnoses    Well woman exam     -  Primary   Relevant Orders   Cytology - PAP   MM 3D SCREEN BREAST BILATERAL   Irregular periods       Birth control counseling       Encounter for BCP (birth control pills) initial prescription       Screening for cervical cancer       Relevant Orders   Cytology - PAP   Screening mammogram for breast cancer       Relevant Orders   MM 3D SCREEN BREAST BILATERAL      Plan:  Pap: Pap Co Test   Mammogram: Ordered  Labs: Declined labs today   Routine preventative health maintenance measures emphasized: Exercise/Diet/Weight control, Tobacco Warnings, Alcohol/Substance use risks, Stress Management and Peer Pressure Issues  Discussed her previous lab results of low Vit. D. Patient crushes her pills and that is why  she has not been taking her prescription. Encouraged patient to take liquid supplement.  Samples of Lo Lo Birth control given, patient instructed to let CNM know if pills work well and request prescription.   RTC x 1 year for ANNUAL exam or sooner if needed.   Daneil Dan, [place]  09/23/20 4:01 PM

## 2020-09-28 LAB — CYTOLOGY - PAP
Comment: NEGATIVE
Diagnosis: NEGATIVE
Diagnosis: REACTIVE
High risk HPV: NEGATIVE

## 2020-10-11 ENCOUNTER — Other Ambulatory Visit: Payer: Self-pay | Admitting: Certified Nurse Midwife

## 2020-10-11 ENCOUNTER — Telehealth: Payer: Self-pay | Admitting: Certified Nurse Midwife

## 2020-10-11 DIAGNOSIS — N926 Irregular menstruation, unspecified: Secondary | ICD-10-CM

## 2020-10-11 MED ORDER — NORGESTIMATE-ETH ESTRADIOL 0.25-35 MG-MCG PO TABS: 1.0000 | ORAL_TABLET | Freq: Every day | ORAL | 11 refills | Status: DC

## 2020-10-11 NOTE — Telephone Encounter (Signed)
Elizabeth Lawson called in and stated she started her birth control pills on the day of her last visit on 3/10.  Elizabeth Lawson states she started her period on 3/19 and has been bleeding ever since.  Elizabeth Lawson did state sometimes it is light and other times it is heavy.  Elizabeth Lawson would like some guidance.  Please advise.

## 2020-10-11 NOTE — Progress Notes (Signed)
Rx Sprintec, see orders.    Dani Gobble, CNM [facility], [facility] 10/11/20 4:28 PM

## 2020-10-11 NOTE — Telephone Encounter (Signed)
MyChart message sent to patient. Thanks, JMl

## 2020-10-11 NOTE — Telephone Encounter (Signed)
Please advise. Thanks Cristopher Ciccarelli 

## 2020-11-12 DIAGNOSIS — J029 Acute pharyngitis, unspecified: Secondary | ICD-10-CM | POA: Diagnosis not present

## 2020-12-15 IMAGING — MG MM CLIP PLACEMENT
4 series · 4 of 12 positions shown · non-contrast
Comparison: Previous exam(s).

CLINICAL DATA: Status post ultrasound-guided core biopsy of a left
breast mass.

EXAM:
3D DIAGNOSTIC LEFT MAMMOGRAM POST ULTRASOUND BIOPSY

[L ML synth-2D]
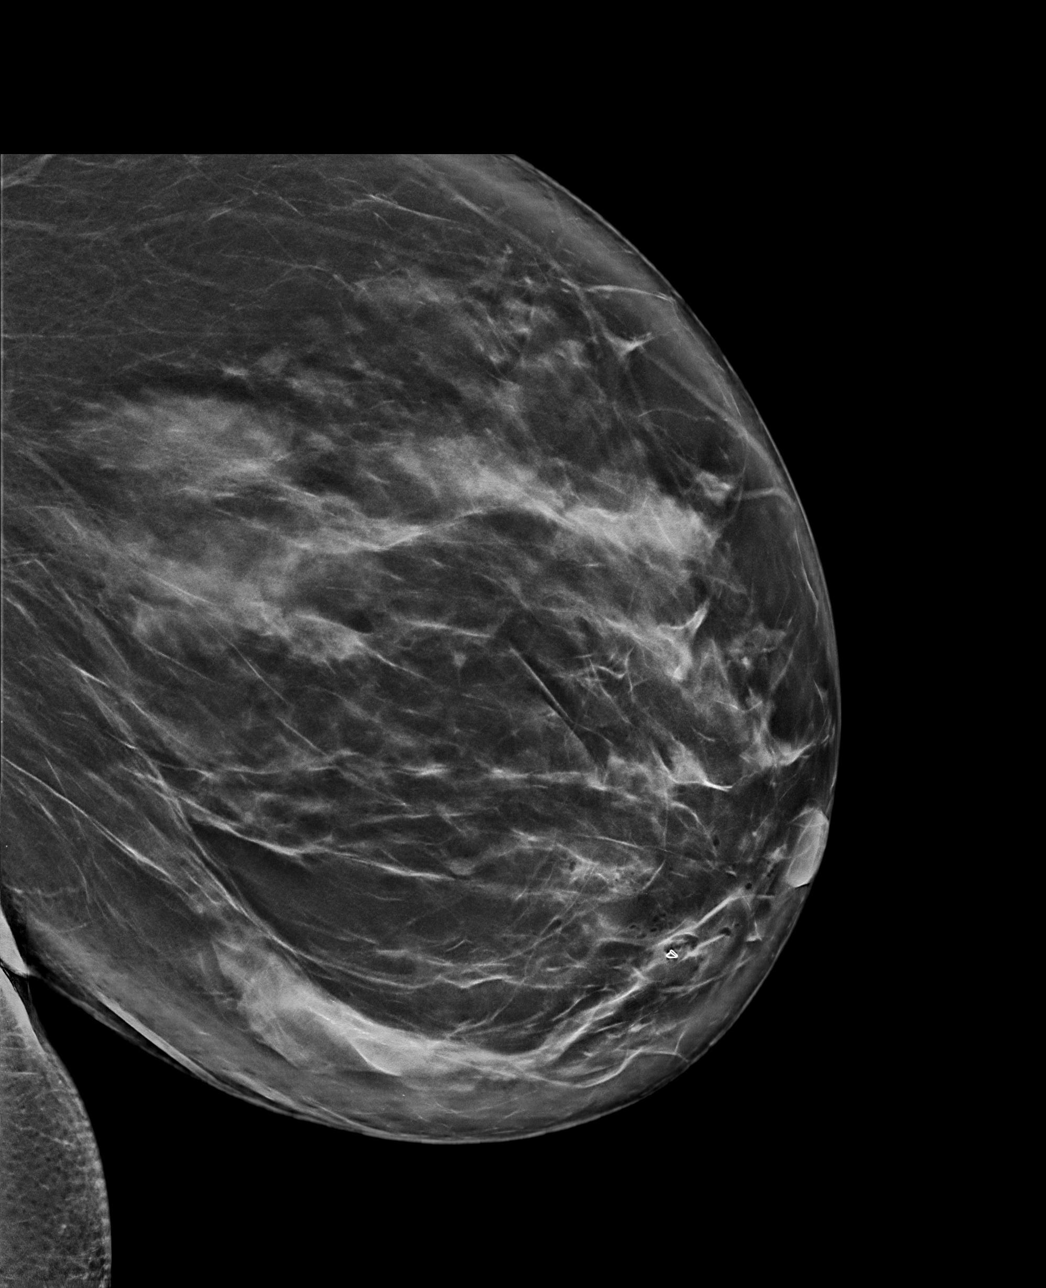

[L CC synth-2D]
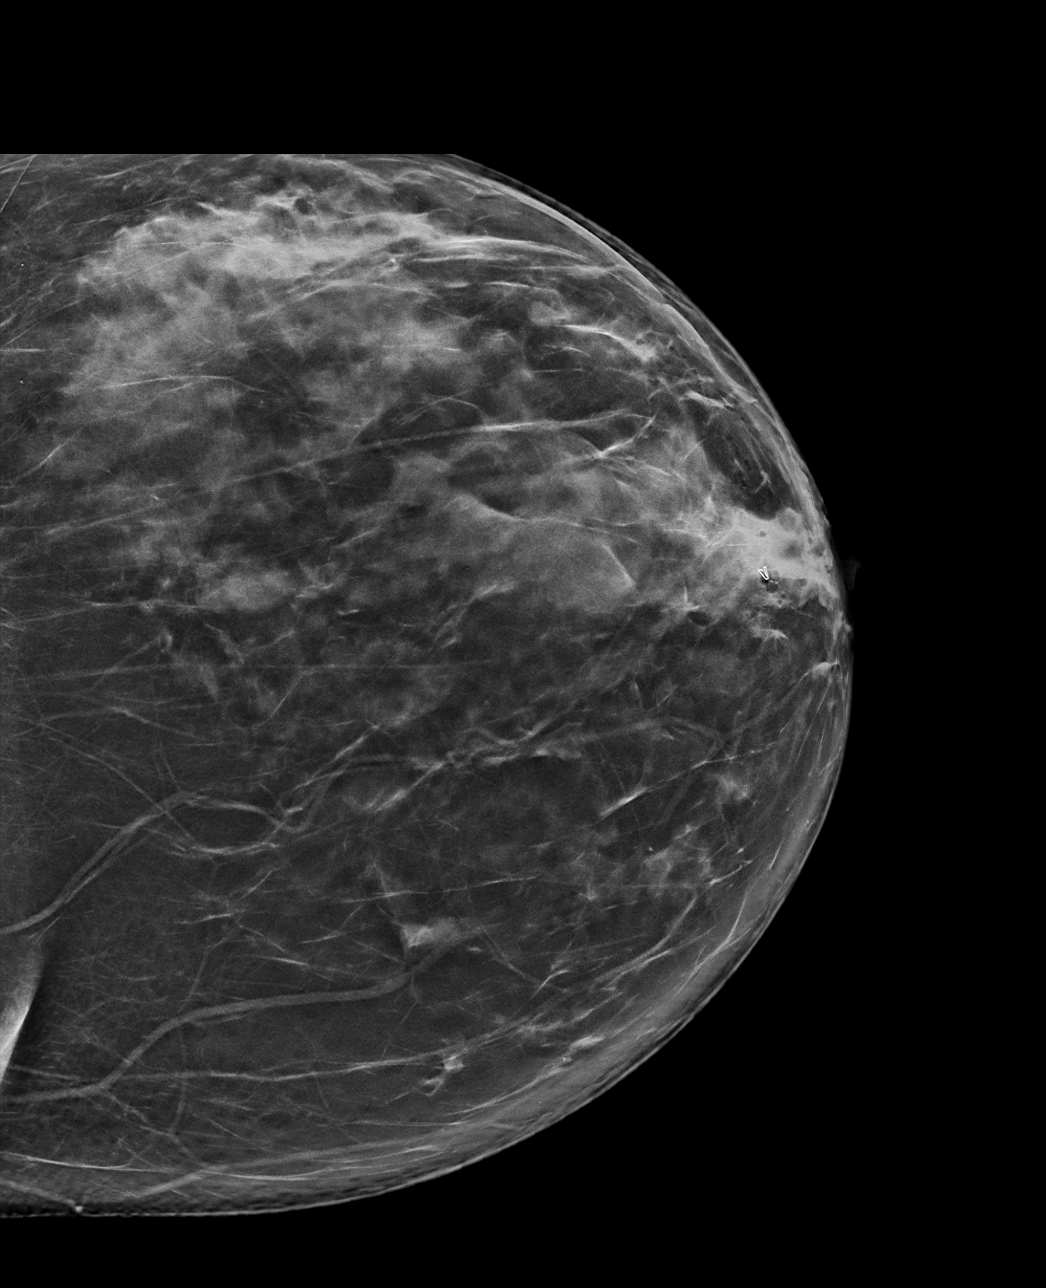

[L ML tomo · tomo slice 50/99.0]
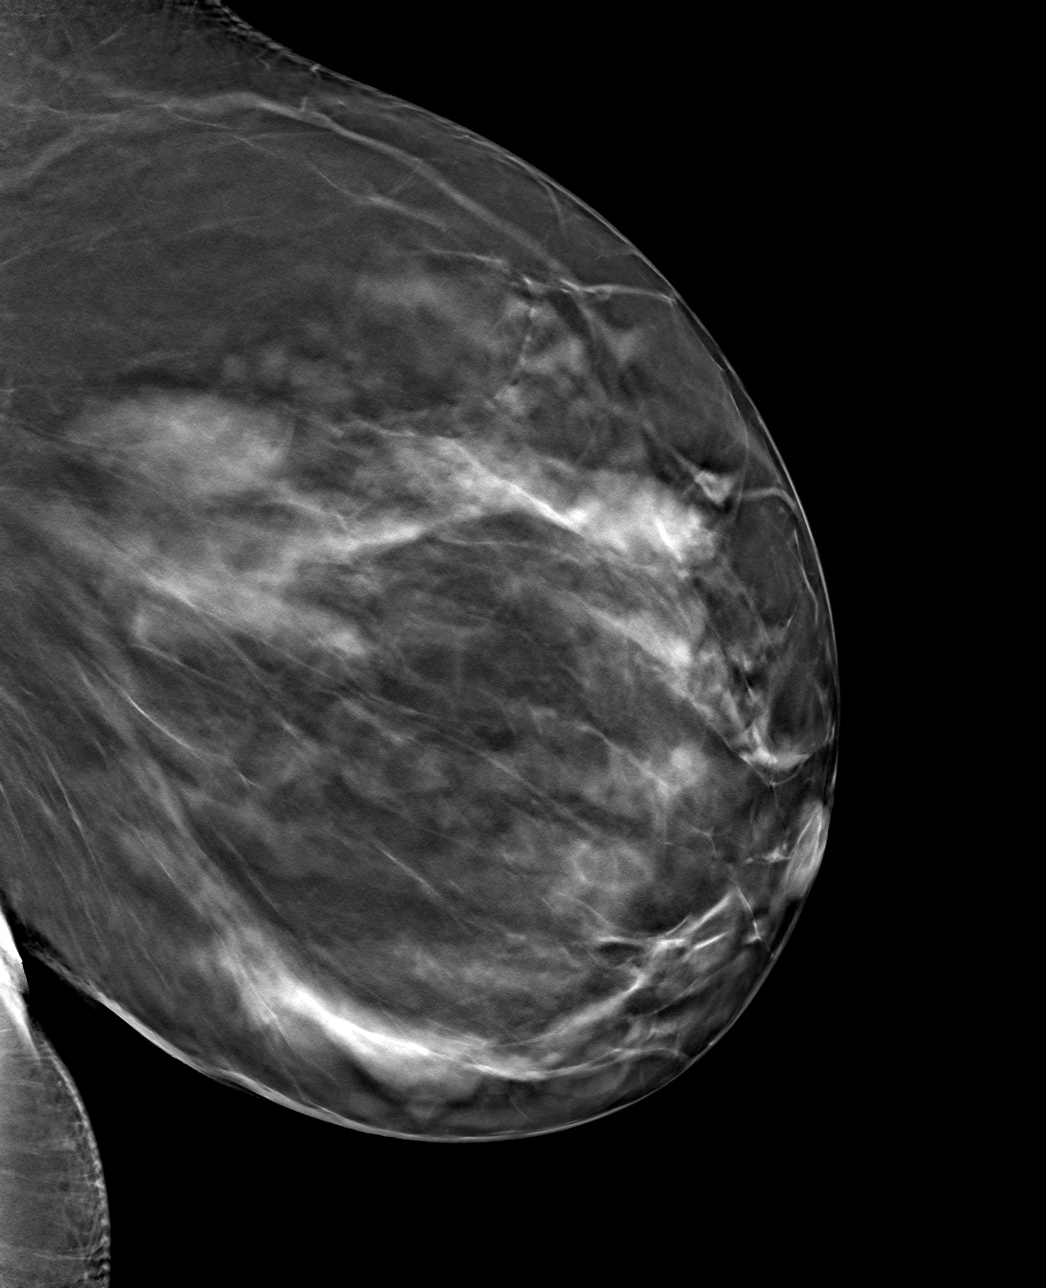

[L CC tomo · tomo slice 47/94.0]
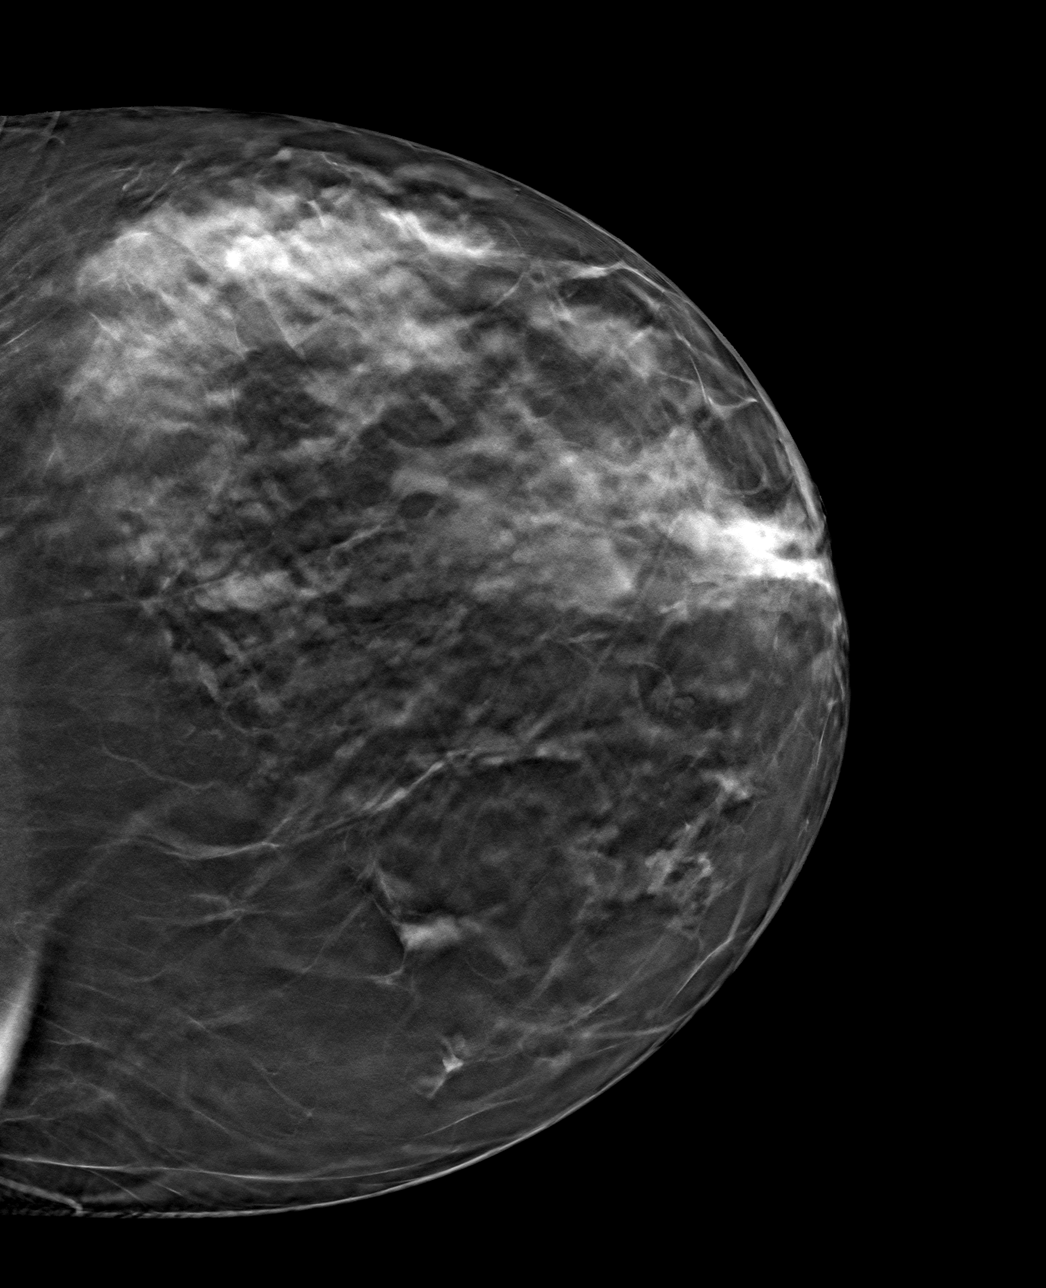

[4 of 12 positions shown; findings below may reference images not displayed]

FINDINGS: 3D Mammographic images were obtained following ultrasound guided
biopsy of a mass in the 6 o'clock retroareolar region of the left
breast. Mammographic images show there is a heart shaped clip in
appropriate position in the left breast.
IMPRESSION: Status post ultrasound-guided core biopsy of the left breast with
pathology pending.

Final Assessment: Post Procedure Mammograms for Marker Placement

## 2021-02-16 ENCOUNTER — Other Ambulatory Visit: Payer: Self-pay

## 2021-02-16 ENCOUNTER — Ambulatory Visit: Payer: 59 | Admitting: Obstetrics and Gynecology

## 2021-02-16 ENCOUNTER — Encounter: Payer: Self-pay | Admitting: Obstetrics and Gynecology

## 2021-02-16 VITALS — BP 127/86 | HR 105 | Ht 70.0 in | Wt 245.0 lb

## 2021-02-16 DIAGNOSIS — Z3009 Encounter for other general counseling and advice on contraception: Secondary | ICD-10-CM | POA: Diagnosis not present

## 2021-02-16 DIAGNOSIS — N920 Excessive and frequent menstruation with regular cycle: Secondary | ICD-10-CM | POA: Diagnosis not present

## 2021-02-16 NOTE — Progress Notes (Signed)
HPI:      [name] is a 40 y.o. G1P1001 who LMP was Patient's last menstrual period was 01/24/2021 (approximate).  Subjective:   She presents today stating that she would like to consider a different form of cycle control.  She has a tubal ligation for birth control but was having 2 periods per month and they were heavy.  She was started on OCPs and these have now regulated her cycle so that she has only 1 menses per month yet it still last 7 to 9 days.  She previously had Mirena and liked it for birth control and cycle control.  She is interested again in [place] IUD.    Hx: The following portions of the patient's history were reviewed and updated as appropriate:             She  has a past medical history of Dysphagia, Environmental and seasonal allergies, GERD (gastroesophageal reflux disease), Irregular periods, and Vaginal Pap smear, abnormal. She does not have a problem list on file. She  has a past surgical history that includes Esophagogastroduodenoscopy (N/A, 12/19/2014); Foreign Body Removal (N/A, 12/19/2014); Tubal ligation (Bilateral); coloposcopy (06/14/2015); Partial mastectomy with needle localization (Left, 03/28/2019); Breast biopsy (Left, 02/24/2019); and Breast lumpectomy (Left, 03/2019). Her family history includes Breast cancer in an other family member; Depression in her father; Heart disease in her paternal grandfather; Hypertension in her father and paternal grandfather; Lung cancer in an other family member; Melanoma in her father; Ovarian cancer in her mother and another family member; Ulcerative colitis in her father. She  reports that she has never smoked. She has never used smokeless tobacco. She reports that she does not drink alcohol and does not use drugs. She has a current medication list which includes the following prescription(s): diphenhydramine, fexofenadine, and norgestimate-ethinyl estradiol. She has No Known Allergies.       Review of Systems:   Review of Systems  Constitutional: Denied constitutional symptoms, night sweats, recent illness, fatigue, fever, insomnia and weight loss.  Eyes: Denied eye symptoms, eye pain, photophobia, vision change and visual disturbance.  Ears/Nose/Throat/Neck: Denied ear, nose, throat or neck symptoms, hearing loss, nasal discharge, sinus congestion and sore throat.  Cardiovascular: Denied cardiovascular symptoms, arrhythmia, chest pain/pressure, edema, exercise intolerance, orthopnea and palpitations.  Respiratory: Denied pulmonary symptoms, asthma, pleuritic pain, productive sputum, cough, dyspnea and wheezing.  Gastrointestinal: Denied, gastro-esophageal reflux, melena, nausea and vomiting.  Genitourinary: See HPI for additional information.  Musculoskeletal: Denied musculoskeletal symptoms, stiffness, swelling, muscle weakness and myalgia.  Dermatologic: Denied dermatology symptoms, rash and scar.  Neurologic: Denied neurology symptoms, dizziness, headache, neck pain and syncope.  Psychiatric: Denied psychiatric symptoms, anxiety and depression.  Endocrine: Denied endocrine symptoms including hot flashes and night sweats.   Meds:   Current Outpatient Medications on File Prior to Visit  Medication Sig Dispense Refill   diphenhydrAMINE (BENADRYL) 25 MG tablet Take 100 mg by mouth daily.     fexofenadine (ALLEGRA) 180 MG tablet Take 180 mg by mouth daily.     norgestimate-ethinyl estradiol (ORTHO-CYCLEN) 0.25-35 MG-MCG tablet Take 1 tablet by mouth daily. 28 tablet 11   No current facility-administered medications on file prior to visit.      Objective:     Vitals:   02/16/21 1413  BP: 127/86  Pulse: (!) 105   Filed Weights   02/16/21 1413  Weight: 245 lb (111.1 kg)                Assessment:  G1P1001 There are no problems to display for this patient.    1. Birth control counseling   2. Menorrhagia with regular cycle     Patient desires IUD   Plan:            1.   IUD Literature on IUD made available.  Risks and benefits discussed.  She is considering IUD as an option for birth/cycle control.  She will return next week at the end of her OCPs and during her menses for IUD insertion.  Orders No orders of the defined types were placed in this encounter.   No orders of the defined types were placed in this encounter.     F/U  Return in about 1 week (around 02/23/2021). I spent 21 minutes involved in the care of this patient preparing to see the patient by obtaining and reviewing her medical history (including labs, imaging tests and prior procedures), documenting clinical information in the electronic health record (EHR), counseling and coordinating care plans, writing and sending prescriptions, ordering tests or procedures and in direct communicating with the patient and medical staff discussing pertinent items from her history and physical exam.  Finis Bud, M.D. 02/16/2021 2:45 PM

## 2021-02-23 ENCOUNTER — Ambulatory Visit: Payer: 59 | Admitting: Obstetrics and Gynecology

## 2021-02-23 ENCOUNTER — Other Ambulatory Visit: Payer: Self-pay

## 2021-02-23 ENCOUNTER — Encounter: Payer: Self-pay | Admitting: Obstetrics and Gynecology

## 2021-02-23 VITALS — BP 122/81 | HR 79 | Resp 16 | Ht 70.0 in | Wt 244.9 lb

## 2021-02-23 DIAGNOSIS — N920 Excessive and frequent menstruation with regular cycle: Secondary | ICD-10-CM

## 2021-02-23 DIAGNOSIS — Z30430 Encounter for insertion of intrauterine contraceptive device: Secondary | ICD-10-CM | POA: Diagnosis not present

## 2021-02-23 NOTE — Progress Notes (Signed)
HPI:      Elizabeth Lawson is a 40 y.o. G1P1001 who LMP was Patient's last menstrual period was 01/24/2021 (approximate).  Subjective:   She presents today for IUD insertion.  She has had problems with menorrhagia and menometrorrhagia despite use of OCPs. She desires IUD for control of her menorrhagia.  She has previously and successfully had an IUD   Hx: The following portions of the patient's history were reviewed and updated as appropriate:             She  has a past medical history of Dysphagia, Environmental and seasonal allergies, GERD (gastroesophageal reflux disease), Irregular periods, and Vaginal Pap smear, abnormal. She does not have a problem list on file. She  has a past surgical history that includes Esophagogastroduodenoscopy (N/A, 12/19/2014); Foreign Body Removal (N/A, 12/19/2014); Tubal ligation (Bilateral); coloposcopy (06/14/2015); Partial mastectomy with needle localization (Left, 03/28/2019); Breast biopsy (Left, 02/24/2019); and Breast lumpectomy (Left, 03/2019). Her family history includes Breast cancer in an other family member; Depression in her father; Heart disease in her paternal grandfather; Hypertension in her father and paternal grandfather; Lung cancer in an other family member; Melanoma in her father; Ovarian cancer in her mother and another family member; Ulcerative colitis in her father. She  reports that she has never smoked. She has never used smokeless tobacco. She reports that she does not drink alcohol and does not use drugs. She has a current medication list which includes the following prescription(s): levonorgestrel, diphenhydramine, and fexofenadine. She has No Known Allergies.       Review of Systems:  Review of Systems  Constitutional: Denied constitutional symptoms, night sweats, recent illness, fatigue, fever, insomnia and weight loss.  Eyes: Denied eye symptoms, eye pain, photophobia, vision change and visual disturbance.   Ears/Nose/Throat/Neck: Denied ear, nose, throat or neck symptoms, hearing loss, nasal discharge, sinus congestion and sore throat.  Cardiovascular: Denied cardiovascular symptoms, arrhythmia, chest pain/pressure, edema, exercise intolerance, orthopnea and palpitations.  Respiratory: Denied pulmonary symptoms, asthma, pleuritic pain, productive sputum, cough, dyspnea and wheezing.  Gastrointestinal: Denied, gastro-esophageal reflux, melena, nausea and vomiting.  Genitourinary: See HPI for additional information.  Musculoskeletal: Denied musculoskeletal symptoms, stiffness, swelling, muscle weakness and myalgia.  Dermatologic: Denied dermatology symptoms, rash and scar.  Neurologic: Denied neurology symptoms, dizziness, headache, neck pain and syncope.  Psychiatric: Denied psychiatric symptoms, anxiety and depression.  Endocrine: Denied endocrine symptoms including hot flashes and night sweats.   Meds:   Current Outpatient Medications on File Prior to Visit  Medication Sig Dispense Refill   diphenhydrAMINE (BENADRYL) 25 MG tablet Take 100 mg by mouth daily.     fexofenadine (ALLEGRA) 180 MG tablet Take 180 mg by mouth daily.     norgestimate-ethinyl estradiol (ORTHO-CYCLEN) 0.25-35 MG-MCG tablet Take 1 tablet by mouth daily. 28 tablet 11   No current facility-administered medications on file prior to visit.    Objective:     There were no vitals filed for this visit.  Physical examination   Pelvic:   Vulva: Normal appearance.  No lesions.  Vagina: No lesions or abnormalities noted.  Support: Normal pelvic support.  Urethra No masses tenderness or scarring.  Meatus Normal size without lesions or prolapse.  Cervix: Normal appearance.  No lesions.  Anus: Normal exam.  No lesions.  Perineum: Normal exam.  No lesions.        Bimanual   Uterus: Normal size.  Non-tender.  Mobile.  AV.  Adnexae: No masses.  Non-tender to palpation.  Cul-de-sac:  Negative for abnormality.   IUD  Procedure Pt has read the booklet and signed the appropriate forms regarding the Mirena IUD.  All of her questions have been answered.   The cervix was cleansed with betadine solution.  After sounding the uterus and noting the position, the IUD was placed in the usual manner without problem.  The string was cut to the appropriate length.  The patient tolerated the procedure well.           [place] # = [id number]   Assessment:    G1P1001 There are no problems to display for this patient.    1. Encounter for insertion of mirena IUD   2. Menorrhagia with regular cycle       Plan:             F/U  Return in about 4 weeks (around 03/23/2021) for For IUD f/u.  Finis Bud, M.D. 02/23/2021 11:42 AM

## 2021-03-23 ENCOUNTER — Ambulatory Visit: Payer: 59 | Admitting: Obstetrics and Gynecology

## 2021-03-23 ENCOUNTER — Encounter: Payer: Self-pay | Admitting: Obstetrics and Gynecology

## 2021-03-23 ENCOUNTER — Other Ambulatory Visit: Payer: Self-pay

## 2021-03-23 VITALS — BP 125/86 | HR 72 | Ht 70.0 in | Wt 245.3 lb

## 2021-03-23 DIAGNOSIS — Z30431 Encounter for routine checking of intrauterine contraceptive device: Secondary | ICD-10-CM

## 2021-03-23 NOTE — Progress Notes (Signed)
HPI:      Elizabeth Lawson is a 40 y.o. G1P1001 who LMP was Patient's last menstrual period was 03/22/2021 (exact date).  Subjective:   She presents today for IUD check.  She reports that she had some bleeding that resolved and now she has some spotting but she expects resolution of this as well.  She reports as being very light.  She had Mirena before and she was happy with it her expectation is that she will not continue to have bleeding. She has had intercourse without issue with the strings.    Hx: The following portions of the patient's history were reviewed and updated as appropriate:             She  has a past medical history of Dysphagia, Environmental and seasonal allergies, GERD (gastroesophageal reflux disease), Irregular periods, and Vaginal Pap smear, abnormal. She does not have a problem list on file. She  has a past surgical history that includes Esophagogastroduodenoscopy (N/A, 12/19/2014); Foreign Body Removal (N/A, 12/19/2014); Tubal ligation (Bilateral); coloposcopy (06/14/2015); Partial mastectomy with needle localization (Left, 03/28/2019); Breast biopsy (Left, 02/24/2019); and Breast lumpectomy (Left, 03/2019). Her family history includes Breast cancer in an other family member; Depression in her father; Heart disease in her paternal grandfather; Hypertension in her father and paternal grandfather; Lung cancer in an other family member; Melanoma in her father; Ovarian cancer in her mother and another family member; Ulcerative colitis in her father. She  reports that she has never smoked. She has never used smokeless tobacco. She reports that she does not drink alcohol and does not use drugs. She has a current medication list which includes the following prescription(s): diphenhydramine, fexofenadine, and levonorgestrel. She has No Known Allergies.       Review of Systems:  Review of Systems  Constitutional: Denied constitutional symptoms, night sweats, recent illness,  fatigue, fever, insomnia and weight loss.  Eyes: Denied eye symptoms, eye pain, photophobia, vision change and visual disturbance.  Ears/Nose/Throat/Neck: Denied ear, nose, throat or neck symptoms, hearing loss, nasal discharge, sinus congestion and sore throat.  Cardiovascular: Denied cardiovascular symptoms, arrhythmia, chest pain/pressure, edema, exercise intolerance, orthopnea and palpitations.  Respiratory: Denied pulmonary symptoms, asthma, pleuritic pain, productive sputum, cough, dyspnea and wheezing.  Gastrointestinal: Denied, gastro-esophageal reflux, melena, nausea and vomiting.  Genitourinary: Denied genitourinary symptoms including symptomatic vaginal discharge, pelvic relaxation issues, and urinary complaints.  Musculoskeletal: Denied musculoskeletal symptoms, stiffness, swelling, muscle weakness and myalgia.  Dermatologic: Denied dermatology symptoms, rash and scar.  Neurologic: Denied neurology symptoms, dizziness, headache, neck pain and syncope.  Psychiatric: Denied psychiatric symptoms, anxiety and depression.  Endocrine: Denied endocrine symptoms including hot flashes and night sweats.   Meds:   Current Outpatient Medications on File Prior to Visit  Medication Sig Dispense Refill   diphenhydrAMINE (BENADRYL) 25 MG tablet Take 100 mg by mouth daily.     fexofenadine (ALLEGRA) 180 MG tablet Take 180 mg by mouth daily.     levonorgestrel (MIRENA) 20 MCG/DAY IUD 1 each by Intrauterine route once.     No current facility-administered medications on file prior to visit.      Objective:     Vitals:   03/23/21 0848  BP: 125/86  Pulse: 72   Filed Weights   03/23/21 0848  Weight: 245 lb 4.8 oz (111.3 kg)              Physical examination   Pelvic:   Vulva: Normal appearance.  No lesions.  Vagina: No lesions  or abnormalities noted.  Support: Normal pelvic support.  Urethra No masses tenderness or scarring.  Meatus Normal size without lesions or prolapse.   Cervix: Normal appearance.  No lesions. IUD strings noted at cervical os.  Anus: Normal exam.  No lesions.  Perineum: Normal exam.  No lesions.        Bimanual   Uterus: Normal size.  Non-tender.  Mobile.  AV.  Adnexae: No masses.  Non-tender to palpation.  Cul-de-sac: Negative for abnormality.             Assessment:    G1P1001 There are no problems to display for this patient.    1. Surveillance of previously prescribed intrauterine contraceptive device        Plan:            1.  Follow-up for annual exam.  Expect resolution of bleeding. Orders No orders of the defined types were placed in this encounter.   No orders of the defined types were placed in this encounter.     F/U  Return for Annual Physical. I spent 21 minutes involved in the care of this patient preparing to see the patient by obtaining and reviewing her medical history (including labs, imaging tests and prior procedures), documenting clinical information in the electronic health record (EHR), counseling and coordinating care plans, writing and sending prescriptions, ordering tests or procedures and in direct communicating with the patient and medical staff discussing pertinent items from her history and physical exam.  Finis Bud, M.D. 03/23/2021 9:29 AM

## 2021-09-26 ENCOUNTER — Encounter: Payer: 59 | Admitting: Certified Nurse Midwife

## 2021-09-27 ENCOUNTER — Encounter: Payer: 59 | Admitting: Obstetrics and Gynecology

## 2021-09-27 DIAGNOSIS — Z01419 Encounter for gynecological examination (general) (routine) without abnormal findings: Secondary | ICD-10-CM

## 2021-09-27 DIAGNOSIS — Z1231 Encounter for screening mammogram for malignant neoplasm of breast: Secondary | ICD-10-CM

## 2021-11-09 ENCOUNTER — Ambulatory Visit (INDEPENDENT_AMBULATORY_CARE_PROVIDER_SITE_OTHER): Payer: 59 | Admitting: Obstetrics and Gynecology

## 2021-11-09 ENCOUNTER — Encounter: Payer: Self-pay | Admitting: Obstetrics and Gynecology

## 2021-11-09 VITALS — BP 123/85 | HR 78 | Ht 70.0 in | Wt 258.3 lb

## 2021-11-09 DIAGNOSIS — Z01419 Encounter for gynecological examination (general) (routine) without abnormal findings: Secondary | ICD-10-CM | POA: Diagnosis not present

## 2021-11-09 DIAGNOSIS — Z1231 Encounter for screening mammogram for malignant neoplasm of breast: Secondary | ICD-10-CM

## 2021-11-09 NOTE — Progress Notes (Signed)
Patients presents for annual exam today. She  states a brown discharge every 2-3 weeks with her IUD, states it is brown with an odor. Patient is up to date on pap smear.  Patient is due for mammogram, ordered. Patients annual labs are ordered.  ?Patient states no other questions or concerns at this time.   ?

## 2021-11-09 NOTE — Progress Notes (Signed)
HPI: ?     [name] is a 41 y.o. G1P1001 who LMP was No LMP recorded. (Menstrual status: IUD). ? ?Subjective:  ? ?She presents today for her annual examination.  She has no complaints.  She has an IUD in place and has occasional spotting each month but this does not disturb her.  She does check her strings and reports she feels them each month. ?She is fasting today for blood work. ? ?  Hx: ?The following portions of the patient's history were reviewed and updated as appropriate: ?            She  has a past medical history of Dysphagia, Environmental and seasonal allergies, GERD (gastroesophageal reflux disease), Irregular periods, and Vaginal Pap smear, abnormal. ?She does not have a problem list on file. ?She  has a past surgical history that includes Esophagogastroduodenoscopy (N/A, 12/19/2014); Foreign Body Removal (N/A, 12/19/2014); Tubal ligation (Bilateral); coloposcopy (06/14/2015); Partial mastectomy with needle localization (Left, 03/28/2019); Breast biopsy (Left, 02/24/2019); and Breast lumpectomy (Left, 03/2019). ?Her family history includes Breast cancer in an other family member; Depression in her father; Heart disease in her paternal grandfather; Hypertension in her father and paternal grandfather; Lung cancer in an other family member; Melanoma in her father; Ovarian cancer in her mother and another family member; Ulcerative colitis in her father. ?She  reports that she has never smoked. She has never used smokeless tobacco. She reports that she does not drink alcohol and does not use drugs. ?She has a current medication list which includes the following prescription(s): diphenhydramine, fexofenadine, and levonorgestrel. ?She has No Known Allergies. ?      ?Review of Systems:  ?Review of Systems ? ?Constitutional: Denied constitutional symptoms, night sweats, recent illness, fatigue, fever, insomnia and weight loss.  ?Eyes: Denied eye symptoms, eye pain, photophobia, vision change and  visual disturbance.  ?Ears/Nose/Throat/Neck: Denied ear, nose, throat or neck symptoms, hearing loss, nasal discharge, sinus congestion and sore throat.  ?Cardiovascular: Denied cardiovascular symptoms, arrhythmia, chest pain/pressure, edema, exercise intolerance, orthopnea and palpitations.  ?Respiratory: Denied pulmonary symptoms, asthma, pleuritic pain, productive sputum, cough, dyspnea and wheezing.  ?Gastrointestinal: Denied, gastro-esophageal reflux, melena, nausea and vomiting.  ?Genitourinary: Denied genitourinary symptoms including symptomatic vaginal discharge, pelvic relaxation issues, and urinary complaints.  ?Musculoskeletal: Denied musculoskeletal symptoms, stiffness, swelling, muscle weakness and myalgia.  ?Dermatologic: Denied dermatology symptoms, rash and scar.  ?Neurologic: Denied neurology symptoms, dizziness, headache, neck pain and syncope.  ?Psychiatric: Denied psychiatric symptoms, anxiety and depression.  ?Endocrine: Denied endocrine symptoms including hot flashes and night sweats.  ? ?Meds: ?  ?Current Outpatient Medications on File Prior to Visit  ?Medication Sig Dispense Refill  ? diphenhydrAMINE (BENADRYL) 25 MG tablet Take 100 mg by mouth daily.    ? fexofenadine (ALLEGRA) 180 MG tablet Take 180 mg by mouth daily.    ? levonorgestrel (MIRENA) 20 MCG/DAY IUD 1 each by Intrauterine route once.    ? ?No current facility-administered medications on file prior to visit.  ? ? ? ?Objective:  ?  ? ?Vitals:  ? 11/09/21 0809  ?BP: 123/85  ?Pulse: 78  ?  ?Filed Weights  ? 11/09/21 0809  ?Weight: 258 lb 4.8 oz (117.2 kg)  ? ?  ?         Physical examination ?General NAD, Conversant  ?HEENT Atraumatic; Op clear with mmm.  Normo-cephalic. Pupils reactive. Anicteric sclerae  ?Thyroid/Neck Smooth without nodularity or enlargement. Normal ROM.  Neck Supple.  ?Skin No rashes, lesions or  ulceration. Normal palpated skin turgor. No nodularity.  ?Breasts: No masses or discharge.  Symmetric.  No axillary  adenopathy.  ?Lungs: Clear to auscultation.No rales or wheezes. Normal Respiratory effort, no retractions.  ?Heart: NSR.  No murmurs or rubs appreciated. No periferal edema  ?Abdomen: Soft.  Non-tender.  No masses.  No HSM. No hernia  ?Extremities: Moves all appropriately.  Normal ROM for age. No lymphadenopathy.  ?Neuro: Oriented to PPT.  Normal mood. Normal affect.  ? ?  Pelvic:   ?Vulva: Normal appearance.  No lesions.  ?Vagina: No lesions or abnormalities noted.  ?Support: Normal pelvic support.  ?Urethra No masses tenderness or scarring.  ?Meatus Normal size without lesions or prolapse.  ?Cervix: Normal appearance.  No lesions.  IUD strings noted at cervical os  ?Anus: Normal exam.  No lesions.  ?Perineum: Normal exam.  No lesions.  ?      Bimanual   ?Uterus: Normal size.  Non-tender.  Mobile.  AV.  ?Adnexae: No masses.  Non-tender to palpation.  ?Cul-de-sac: Negative for abnormality.  ? ? ? ?Assessment:  ?  ?G1P1001 ?There are no problems to display for this patient. ? ?  ?1. Well woman exam with routine gynecological exam   ?2. Screening mammogram for breast cancer   ? ?  ? ? ?Plan:  ?  ?       ? 1.  Basic Screening Recommendations ?The basic screening recommendations for asymptomatic women were discussed with the patient during her visit.  The age-appropriate recommendations were discussed with her and the rational for the tests reviewed.  When I am informed by the patient that another primary care physician has previously obtained the age-appropriate tests and they are up-to-date, only outstanding tests are ordered and referrals given as necessary.  Abnormal results of tests will be discussed with her when all of her results are completed.  Routine preventative health maintenance measures emphasized: Exercise/Diet/Weight control, Tobacco Warnings, Alcohol/Substance use risks and Stress Management ?Mammogram ordered -blood work today ?Orders ?Orders Placed This Encounter  ?Procedures  ? MM DIGITAL SCREENING  BILATERAL  ? Basic metabolic panel  ? CBC  ? TSH  ? Lipid panel  ? Hemoglobin A1c  ? ? No orders of the defined types were placed in this encounter. ?    ?  ?  F/U ? Return in about 1 year (around 11/10/2022) for Annual Physical. ? ?Finis Bud, M.D. ?11/09/2021 ?8:22 AM ? ? ? ?

## 2021-11-10 LAB — BASIC METABOLIC PANEL
BUN/Creatinine Ratio: 9 (ref 9–23)
BUN: 8 mg/dL (ref 6–24)
CO2: 21 mmol/L (ref 20–29)
Calcium: 9.4 mg/dL (ref 8.7–10.2)
Chloride: 105 mmol/L (ref 96–106)
Creatinine, Ser: 0.87 mg/dL (ref 0.57–1.00)
Glucose: 74 mg/dL (ref 70–99)
Potassium: 4.9 mmol/L (ref 3.5–5.2)
Sodium: 141 mmol/L (ref 134–144)
eGFR: 85 mL/min/{1.73_m2} (ref >59)

## 2021-11-10 LAB — HEMOGLOBIN A1C
Est. average glucose Bld gHb Est-mCnc: 105 mg/dL
Hgb A1c MFr Bld: 5.3 % (ref 4.8–5.6)

## 2021-11-10 LAB — LIPID PANEL
Chol/HDL Ratio: 3.5 ratio (ref 0.0–4.4)
Cholesterol, Total: 174 mg/dL (ref 100–199)
HDL: 50 mg/dL (ref >39)
LDL Chol Calc (NIH): 110 mg/dL — ABNORMAL HIGH (ref 0–99)
Triglycerides: 73 mg/dL (ref 0–149)
VLDL Cholesterol Cal: 14 mg/dL (ref 5–40)

## 2021-11-10 LAB — TSH: TSH: 2.04 u[IU]/mL (ref 0.450–4.500)

## 2021-11-10 LAB — CBC
Hematocrit: 43.6 % (ref 34.0–46.6)
Hemoglobin: 14.3 g/dL (ref 11.1–15.9)
MCH: 28.1 pg (ref 26.6–33.0)
MCHC: 32.8 g/dL (ref 31.5–35.7)
MCV: 86 fL (ref 79–97)
Platelets: 274 10*3/uL (ref 150–450)
RBC: 5.09 x10E6/uL (ref 3.77–5.28)
RDW: 13.3 % (ref 11.7–15.4)
WBC: 5.3 10*3/uL (ref 3.4–10.8)

## 2022-09-19 ENCOUNTER — Telehealth: Payer: Self-pay | Admitting: Obstetrics and Gynecology

## 2022-09-19 NOTE — Telephone Encounter (Signed)
Left message for patient to call office back to r/s appt for 11/14/22 with Dr Amalia Hailey. [facility] message sent

## 2022-09-19 NOTE — Telephone Encounter (Signed)
Pt was rescheduled for May 1 with Dr. Amalia Hailey.

## 2022-09-20 NOTE — Telephone Encounter (Signed)
The patient called and rescheduled for 5/1 with Dr. Amalia Hailey.

## 2022-11-13 ENCOUNTER — Encounter: Payer: Self-pay | Admitting: Obstetrics and Gynecology

## 2022-11-15 ENCOUNTER — Ambulatory Visit: Payer: 59 | Admitting: Obstetrics and Gynecology

## 2022-11-15 DIAGNOSIS — Z1231 Encounter for screening mammogram for malignant neoplasm of breast: Secondary | ICD-10-CM

## 2022-11-15 DIAGNOSIS — Z01419 Encounter for gynecological examination (general) (routine) without abnormal findings: Secondary | ICD-10-CM

## 2022-12-07 ENCOUNTER — Ambulatory Visit: Payer: 59 | Admitting: Obstetrics and Gynecology

## 2022-12-07 DIAGNOSIS — Z30431 Encounter for routine checking of intrauterine contraceptive device: Secondary | ICD-10-CM

## 2022-12-07 DIAGNOSIS — Z1231 Encounter for screening mammogram for malignant neoplasm of breast: Secondary | ICD-10-CM

## 2022-12-07 DIAGNOSIS — Z01419 Encounter for gynecological examination (general) (routine) without abnormal findings: Secondary | ICD-10-CM

## 2023-01-01 ENCOUNTER — Other Ambulatory Visit: Payer: Self-pay | Admitting: Obstetrics

## 2023-01-01 ENCOUNTER — Encounter: Payer: Self-pay | Admitting: Obstetrics

## 2023-01-01 ENCOUNTER — Ambulatory Visit (INDEPENDENT_AMBULATORY_CARE_PROVIDER_SITE_OTHER): Payer: 59 | Admitting: Obstetrics

## 2023-01-01 VITALS — BP 120/77 | HR 82 | Ht 68.0 in | Wt 254.6 lb

## 2023-01-01 DIAGNOSIS — Z01419 Encounter for gynecological examination (general) (routine) without abnormal findings: Secondary | ICD-10-CM | POA: Diagnosis not present

## 2023-01-01 DIAGNOSIS — Z124 Encounter for screening for malignant neoplasm of cervix: Secondary | ICD-10-CM

## 2023-01-01 DIAGNOSIS — N898 Other specified noninflammatory disorders of vagina: Secondary | ICD-10-CM

## 2023-01-01 DIAGNOSIS — Z975 Presence of (intrauterine) contraceptive device: Secondary | ICD-10-CM

## 2023-01-01 DIAGNOSIS — Z1231 Encounter for screening mammogram for malignant neoplasm of breast: Secondary | ICD-10-CM

## 2023-01-01 NOTE — Progress Notes (Signed)
Gynecology Annual Exam  PCP: Loura Pardon, MD (Inactive)  Chief Complaint: No chief complaint on file.   History of Present Illness: Patient is a 42 y.o. G1P1001 presents for annual exam. The patient has no complaints today. 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  She has had a breast biopsy done several years ago (2020) and is over due for a mammogram.  LMP: No LMP recorded. (Menstrual status: IUD).  The patient is sexually active. She currently uses tubal ligation for contraception. She denies dyspareunia.  The patient does perform self breast exams.  There is no notable family history of breast or ovarian cancer in her family.  The patient wears seatbelts: yes.   The patient has regular exercise: no.    The patient denies current symptoms of depression.    Review of Systems: Review of Systems  Constitutional: Negative.   HENT: Negative.    Eyes: Negative.   Respiratory: Negative.    Cardiovascular: Negative.   Genitourinary: Negative.   Musculoskeletal: Negative.   Skin: Negative.   Neurological: Negative.   Endo/Heme/Allergies: Negative.   Psychiatric/Behavioral: Negative.      Past Medical History:  Patient Active Problem List   Diagnosis Date Noted   IUD (intrauterine device) in place 01/01/2023    Past Surgical History:  Past Surgical History:  Procedure Laterality Date   BREAST BIOPSY Left 02/24/2019   u/s bx, papilloma   BREAST LUMPECTOMY Left 03/2019   papilloma   coloposcopy  06/14/2015   bx cervix at 6:00 RESULTS: CIN I (Mild Squamous Dysplasia), LGSIL.  ECC: koilocytic atypia consistent with hpv   ESOPHAGOGASTRODUODENOSCOPY N/A 12/19/2014   Procedure: ESOPHAGOGASTRODUODENOSCOPY (EGD);  Surgeon: Christena Deem, MD;  Location: [facility] ENDOSCOPY;  Service: Endoscopy;  Laterality: N/A;   FOREIGN BODY REMOVAL N/A 12/19/2014   Procedure: FOREIGN BODY REMOVAL;  Surgeon:  Christena Deem, MD;  Location: [facility] ENDOSCOPY;  Service: Endoscopy;  Laterality: N/A;   PARTIAL MASTECTOMY WITH NEEDLE LOCALIZATION Left 03/28/2019   Procedure: PARTIAL MASTECTOMY WITH NEEDLE LOCALIZATION;  Surgeon: Leafy Ro, MD;  Location: [facility] ORS;  Service: General;  Laterality: Left;   TUBAL LIGATION Bilateral     Gynecologic History:  No LMP recorded. (Menstrual status: IUD). Contraception: tubal ligation Last Pap: Results were: NILM in 2022 no abnormalities  Last mammogram: 03/18/2020 Results were: BI-RAD I  Obstetric History: G1P1001  Family History:  Family History  Problem Relation Age of Onset   Ovarian cancer Mother        small cell, stage 1   Hypertension Father    Melanoma Father    Depression Father    Ulcerative colitis Father    COPD Father    Hypertension Paternal Grandfather    Heart disease Paternal Grandfather    Breast cancer Other    Ovarian cancer Other    Lung cancer Other    Colon cancer Neg Hx     Social History:  Social History   Socioeconomic History   Marital status: Married    Spouse name: Not on file   Number of children: Not on file   Years of education: Not on file   Highest education level: Not on file  Occupational History   Not on file  Tobacco Use   Smoking status: Never   Smokeless tobacco: Never  Vaping Use   Vaping Use: Never used  Substance and  Sexual Activity   Alcohol use: No   Drug use: No   Sexual activity: Yes    Birth control/protection: Surgical, I.U.D.    Comment: Tubal ligation  Other Topics Concern   Not on file  Social History Narrative   Not on file   Social Determinants of Health   Financial Resource Strain: Not on file  Food Insecurity: Not on file  Transportation Needs: Not on file  Physical Activity: Not on file  Stress: Not on file  Social Connections: Not on file  Intimate Partner Violence: Not on file    Allergies:  No Known Allergies  Medications: Prior to Admission  medications   Medication Sig Start Date End Date Taking? Authorizing Provider  diphenhydrAMINE (BENADRYL) 25 MG tablet Take 100 mg by mouth daily.   Yes [provider]  fexofenadine (ALLEGRA) 180 MG tablet Take 180 mg by mouth daily.   Yes [provider]  levonorgestrel (MIRENA) 20 MCG/DAY IUD 1 each by Intrauterine route once.   Yes [provider]    Physical Exam Vitals: Blood pressure 120/77, pulse 82, height 5\' 8"  (1.727 m), weight 254 lb 9.6 oz (115.5 kg).  General: NAD HEENT: normocephalic, anicteric Thyroid: no enlargement, no palpable nodules Pulmonary: No increased work of breathing, CTAB Cardiovascular: RRR, distal pulses 2+ Breast: Breast symmetrical, no tenderness, no palpable nodules or masses, no skin or nipple retraction present, no nipple discharge.  No axillary or supraclavicular lymphadenopathy. Abdomen: NABS, soft, non-tender, non-distended.  Umbilicus without lesions.  No hepatomegaly, splenomegaly or masses palpable. No evidence of hernia  Genitourinary:  External: Normal external female genitalia.  Normal urethral meatus, normal Bartholin's and Skene's glands.    Vagina: Normal vaginal mucosa, no evidence of prolapse.    Cervix: Grossly normal in appearance, no bleeding  Uterus: Non-enlarged, mobile, normal contour.  No CMT  Adnexa: ovaries non-enlarged, no adnexal masses  Rectal: deferred  Lymphatic: no evidence of inguinal lymphadenopathy Extremities: no edema, erythema, or tenderness Neurologic: Grossly intact Psychiatric: mood appropriate, affect full  Female chaperone present for pelvic and breast  portions of the physical exam    Assessment: 42 y.o. G1P1001 routine annual exam  Plan: Problem List Items Addressed This Visit       Other   IUD (intrauterine device) in place   Other Visit Diagnoses     Vaginal discharge    -  Primary   Relevant Orders   NuSwab BV and Candida, NAA   Women's annual routine  gynecological examination       Cervical cancer screening       Screening mammogram for breast cancer       Relevant Orders   MM 3D SCREENING MAMMOGRAM BILATERAL BREAST       1) Mammogram - recommend yearly screening mammogram.  Mammogram Was ordered today   2) STI screening  wasoffered and  we retrieved an Aptima swab to check for BV or yeast . From time to time she notices an odor.  3) ASCCP guidelines and rational discussed.  Patient opts for every 3 years screening interval  4) Contraception - the patient is currently using  tubal ligation.  She is happy with her current form of contraception and plans to continue  5) Colonoscopy -- Screening recommended starting at age 32 for average risk individuals, age 30 for individuals deemed at increased risk (including African Americans) and recommended to continue until age 44.  For patient age 40-85 individualized approach is recommended.  Gold standard screening  is via colonoscopy, Cologuard screening is an acceptable alternative for patient unwilling or unable to undergo colonoscopy.  "Colorectal cancer screening for average?risk adults: 2018 guideline update from the American Cancer Society"CA: A Cancer Journal for Clinicians: Dec 13, 2016   6) Routine healthcare maintenance including cholesterol, diabetes screening discussed managed by PCP  7) Return in about 1 year (around 01/01/2024) for annual.   Mirna Mires, CNM  01/01/2023 5:33 PM     01/01/2023, 5:33 PM

## 2023-01-03 LAB — NUSWAB BV AND CANDIDA, NAA
Atopobium vaginae: HIGH Score — AB
BVAB 2: HIGH Score — AB
Candida albicans, NAA: NEGATIVE
Candida glabrata, NAA: NEGATIVE
Megasphaera 1: HIGH Score — AB

## 2023-01-08 ENCOUNTER — Other Ambulatory Visit: Payer: Self-pay

## 2023-01-08 ENCOUNTER — Encounter: Payer: Self-pay | Admitting: Obstetrics

## 2023-01-08 DIAGNOSIS — B9689 Other specified bacterial agents as the cause of diseases classified elsewhere: Secondary | ICD-10-CM

## 2023-01-08 MED ORDER — METRONIDAZOLE 500 MG PO TABS
500.0000 mg | ORAL_TABLET | Freq: Two times a day (BID) | ORAL | 0 refills | Status: DC
Start: 2023-01-08 — End: 2023-01-09

## 2023-01-09 ENCOUNTER — Other Ambulatory Visit: Payer: Self-pay | Admitting: Obstetrics

## 2023-01-09 ENCOUNTER — Encounter: Payer: Self-pay | Admitting: Obstetrics

## 2023-01-09 DIAGNOSIS — B9689 Other specified bacterial agents as the cause of diseases classified elsewhere: Secondary | ICD-10-CM

## 2023-01-09 MED ORDER — METRONIDAZOLE 500 MG PO TABS
500.0000 mg | ORAL_TABLET | Freq: Two times a day (BID) | ORAL | 0 refills | Status: DC
Start: 2023-01-09 — End: 2024-05-15

## 2023-01-29 ENCOUNTER — Other Ambulatory Visit: Payer: Self-pay | Admitting: Obstetrics

## 2023-01-29 DIAGNOSIS — Z1231 Encounter for screening mammogram for malignant neoplasm of breast: Secondary | ICD-10-CM

## 2023-02-14 ENCOUNTER — Ambulatory Visit
Admission: RE | Admit: 2023-02-14 | Discharge: 2023-02-14 | Disposition: A | Discharge status: Home / Self Care | Payer: 59 | Source: Ambulatory Visit | Attending: Obstetrics | Admitting: Obstetrics

## 2023-02-14 DIAGNOSIS — Z1231 Encounter for screening mammogram for malignant neoplasm of breast: Secondary | ICD-10-CM | POA: Diagnosis not present

## 2023-02-15 ENCOUNTER — Other Ambulatory Visit: Payer: Self-pay | Admitting: Obstetrics

## 2023-02-15 DIAGNOSIS — R928 Other abnormal and inconclusive findings on diagnostic imaging of breast: Secondary | ICD-10-CM

## 2023-02-15 DIAGNOSIS — N63 Unspecified lump in unspecified breast: Secondary | ICD-10-CM

## 2024-05-12 NOTE — Progress Notes (Unsigned)
 ANNUAL GYNECOLOGICAL EXAM  HPI  Elizabeth Lawson is a 43 y.o.-year-old G1P1001 who presents for an annual gynecological exam today.  She denies pelvic pain, dyspareunia, and UTI symptoms. She has a history of a left breast mass with lumpectomy (dx papilloma). Last years screening mammogram showed a mass in her right breast. She was recommended to have a diagnostic mammogram with biopsy as follow up. She strongly declines this recommendation. She still has breast and nipple pain when she is cold on her left side following that lumpectomy, and doesn't want to have any invasive procedures done on her right breast.  She reports intermittent bleeding that is slightly more than spotting and occasional cramps with her IUD. She notices a vaginal odor when she has bleeding. She has a hx of bacterial vaginosis.  Medical/Surgical History Past Medical History:  Diagnosis Date   Dysphagia    Environmental and seasonal allergies    GERD (gastroesophageal reflux disease)    Irregular periods    Vaginal Pap smear, abnormal    ASCUS HR HPV negative (06/14/15)   Past Surgical History:  Procedure Laterality Date   BREAST BIOPSY Left 02/24/2019   u/s bx, papilloma   BREAST LUMPECTOMY Left 03/2019   papilloma   coloposcopy  06/14/2015   bx cervix at 6:00 RESULTS: CIN I (Mild Squamous Dysplasia), LGSIL.  ECC: koilocytic atypia consistent with hpv   ESOPHAGOGASTRODUODENOSCOPY N/A 12/19/2014   Procedure: ESOPHAGOGASTRODUODENOSCOPY (EGD);  Surgeon: Gladis RAYMOND Mariner, MD;  Location: [facility] ENDOSCOPY;  Service: Endoscopy;  Laterality: N/A;   FOREIGN BODY REMOVAL N/A 12/19/2014   Procedure: FOREIGN BODY REMOVAL;  Surgeon: Gladis RAYMOND Mariner, MD;  Location: [facility] ENDOSCOPY;  Service: Endoscopy;  Laterality: N/A;   PARTIAL MASTECTOMY WITH NEEDLE LOCALIZATION Left 03/28/2019   Procedure: PARTIAL MASTECTOMY WITH NEEDLE LOCALIZATION;  Surgeon: Jordis Laneta FALCON, MD;  Location: [facility] ORS;  Service: General;  Laterality: Left;    TUBAL LIGATION Bilateral     Social History Lives with spouse and son. Feels safe there. Work: Architect at [employer]  Exercise: walking Substances: Denies EtOH, tobacco, vape, and recreational drugs  Obstetric History OB History     Gravida  1   Para  1   Term  1   Preterm      AB      Living  1      SAB      IAB      Ectopic      Multiple      Live Births  1            GYN/Menstrual History No LMP recorded. (Menstrual status: IUD). Last Pap: 02/23/2021 (NILM, HR HPV Neg) Contraception: Mirena   Prevention Mammogram- reviewed recommendation for follow up. Pt refuses. Discussed re-imaging for assessment of interval changes. She also refuses this.  Colonoscopy- at 43 yo Flu shot/vaccines Declines  Current Medications Outpatient Medications Prior to Visit  Medication Sig   b complex vitamins capsule Take 1 capsule by mouth daily.   diphenhydrAMINE (BENADRYL) 25 MG tablet Take 100 mg by mouth daily.   fexofenadine (ALLEGRA) 180 MG tablet Take 180 mg by mouth daily.   levonorgestrel  (MIRENA ) 20 MCG/DAY IUD 1 each by Intrauterine route once.   [DISCONTINUED] metroNIDAZOLE  (FLAGYL ) 500 MG tablet Take 1 tablet (500 mg total) by mouth 2 (two) times daily.   No facility-administered medications prior to visit.        ROS Constitutional: Denied constitutional symptoms, night sweats, recent illness, fatigue, fever,  insomnia and weight loss.  Eyes: Denied eye symptoms, eye pain, photophobia, vision change and visual disturbance.  Ears/Nose/Throat/Neck: Denied ear, nose, throat or neck symptoms, hearing loss, nasal discharge, sinus congestion and sore throat.  Cardiovascular: Denied cardiovascular symptoms, arrhythmia, chest pain/pressure, edema, exercise intolerance, orthopnea and palpitations.  Respiratory: Denied pulmonary symptoms, asthma, pleuritic pain, productive sputum, cough, dyspnea and wheezing.  Gastrointestinal: Denied  gastro-esophageal reflux, melena, nausea and vomiting.  Genitourinary: Denied genitourinary symptoms including pelvic relaxation issues, and urinary complaints. + symptomatic vaginal discharge  Musculoskeletal: Denied musculoskeletal symptoms, stiffness, swelling, muscle weakness and myalgia.  Dermatologic: Denied dermatology symptoms, rash and scar.  Neurologic: Denied neurology symptoms, dizziness, headache, neck pain and syncope.  Psychiatric: Denied psychiatric symptoms, anxiety and depression.  Endocrine: Denied endocrine symptoms including hot flashes and night sweats.    OBJECTIVE  BP 125/83   Pulse 79   Ht 5' 8 (1.727 m)   Wt 254 lb (115.2 kg)   BMI 38.62 kg/m    Physical examination General NAD, Conversant  HEENT Atraumatic; Op clear with mmm.  Normo-cephalic. Pupils reactive. Anicteric sclerae  Thyroid /Neck Smooth without nodularity or enlargement. Normal ROM.  Neck Supple.  Skin +Hydradenitis scarring and boils underneath breasts   Breasts: No masses or discharge.  Symmetric.  No axillary adenopathy.  Lungs: Clear to auscultation.No rales or wheezes. Normal Respiratory effort, no retractions.  Heart: NSR.  No murmurs or rubs appreciated. No peripheral edema  Abdomen: Soft.  Non-tender.  No masses.  No HSM. No hernia  Extremities: Moves all appropriately.  Normal ROM for age. No lymphadenopathy.  Neuro: Oriented to PPT.  Normal mood. Normal affect.     Pelvic:   Vulva: Normal appearance.  No lesions.  Vagina: No lesions or abnormalities noted.  Support: Normal pelvic support.  Urethra No masses tenderness or scarring.  Meatus Normal size without lesions or prolapse.  Cervix: Normal appearance.  No lesions. +IUD strings visible.  Anus: Normal exam.  No lesions.  Perineum: Normal exam.  No lesions.        Bimanual   Uterus: Normal size.  Non-tender.  Mobile.  AV.  Adnexae: No masses.  Non-tender to palpation.  Cul-de-sac: Negative for abnormality.    ASSESSMENT/  PLAN  1) Annual exam- Physical exam as noted. Discussed healthy lifestyle choices and preventive care. Wellness labs ordered. 2) IUD in place. Swabbed for BV and yeast. If +for BV, would like metrogel .  3) Breast cancer screening refused. Reviewed importance of assessment and follow up of masses. There were no palpable masses on today's exam. She refuses interval re-imaging even without biopsy.  4) Return in one year for annual exam or as needed for concerns.   Lauraine Lakes, CNM

## 2024-05-15 ENCOUNTER — Other Ambulatory Visit (HOSPITAL_COMMUNITY)
Admission: RE | Admit: 2024-05-15 | Discharge: 2024-05-15 | Disposition: A | Discharge status: Home / Self Care | Source: Ambulatory Visit | Attending: Registered Nurse | Admitting: Registered Nurse

## 2024-05-15 ENCOUNTER — Ambulatory Visit: Admitting: Registered Nurse

## 2024-05-15 ENCOUNTER — Encounter: Payer: Self-pay | Admitting: Registered Nurse

## 2024-05-15 VITALS — BP 125/83 | HR 79 | Ht 68.0 in | Wt 254.0 lb

## 2024-05-15 DIAGNOSIS — Z30431 Encounter for routine checking of intrauterine contraceptive device: Secondary | ICD-10-CM

## 2024-05-15 DIAGNOSIS — Z01419 Encounter for gynecological examination (general) (routine) without abnormal findings: Secondary | ICD-10-CM | POA: Insufficient documentation

## 2024-05-15 DIAGNOSIS — N921 Excessive and frequent menstruation with irregular cycle: Secondary | ICD-10-CM | POA: Diagnosis not present

## 2024-05-15 DIAGNOSIS — Z9889 Other specified postprocedural states: Secondary | ICD-10-CM | POA: Insufficient documentation

## 2024-05-15 DIAGNOSIS — Z975 Presence of (intrauterine) contraceptive device: Secondary | ICD-10-CM | POA: Diagnosis not present

## 2024-05-15 DIAGNOSIS — Z1239 Encounter for other screening for malignant neoplasm of breast: Secondary | ICD-10-CM

## 2024-05-15 DIAGNOSIS — L732 Hidradenitis suppurativa: Secondary | ICD-10-CM | POA: Insufficient documentation

## 2024-05-15 DIAGNOSIS — Z1231 Encounter for screening mammogram for malignant neoplasm of breast: Secondary | ICD-10-CM

## 2024-05-16 ENCOUNTER — Ambulatory Visit: Payer: Self-pay | Admitting: Registered Nurse

## 2024-05-16 DIAGNOSIS — B9689 Other specified bacterial agents as the cause of diseases classified elsewhere: Secondary | ICD-10-CM | POA: Insufficient documentation

## 2024-05-16 LAB — TSH: TSH: 1.84 u[IU]/mL (ref 0.450–4.500)

## 2024-05-16 LAB — LIPID PANEL
Chol/HDL Ratio: 4 ratio (ref 0.0–4.4)
Cholesterol, Total: 195 mg/dL (ref 100–199)
HDL: 49 mg/dL (ref >39)
LDL Chol Calc (NIH): 125 mg/dL — ABNORMAL HIGH (ref 0–99)
Triglycerides: 115 mg/dL (ref 0–149)
VLDL Cholesterol Cal: 21 mg/dL (ref 5–40)

## 2024-05-16 LAB — CERVICOVAGINAL ANCILLARY ONLY
Bacterial Vaginitis (gardnerella): POSITIVE — AB
Candida Glabrata: NEGATIVE
Candida Vaginitis: NEGATIVE
Comment: NEGATIVE
Comment: NEGATIVE
Comment: NEGATIVE

## 2024-05-16 LAB — HEMOGLOBIN A1C
Est. average glucose Bld gHb Est-mCnc: 103 mg/dL
Hgb A1c MFr Bld: 5.2 % (ref 4.8–5.6)

## 2024-05-16 LAB — CBC
Hematocrit: 45.4 % (ref 34.0–46.6)
Hemoglobin: 14.6 g/dL (ref 11.1–15.9)
MCH: 29.4 pg (ref 26.6–33.0)
MCHC: 32.2 g/dL (ref 31.5–35.7)
MCV: 92 fL (ref 79–97)
Platelets: 304 x10E3/uL (ref 150–450)
RBC: 4.96 x10E6/uL (ref 3.77–5.28)
RDW: 12.5 % (ref 11.7–15.4)
WBC: 6.3 x10E3/uL (ref 3.4–10.8)

## 2024-05-16 LAB — BASIC METABOLIC PANEL WITH GFR
BUN/Creatinine Ratio: 9 (ref 9–23)
BUN: 8 mg/dL (ref 6–24)
CO2: 19 mmol/L — ABNORMAL LOW (ref 20–29)
Calcium: 9.5 mg/dL (ref 8.7–10.2)
Chloride: 102 mmol/L (ref 96–106)
Creatinine, Ser: 0.91 mg/dL (ref 0.57–1.00)
Glucose: 85 mg/dL (ref 70–99)
Potassium: 4 mmol/L (ref 3.5–5.2)
Sodium: 139 mmol/L (ref 134–144)
eGFR: 80 mL/min/1.73 (ref >59)

## 2024-05-16 MED ORDER — METRONIDAZOLE 0.75 % VA GEL: 1.0000 | Freq: Every day | VAGINAL | 1 refills | Status: AC
